# Patient Record
Sex: Female | Born: 1960 | Hispanic: No | Smoking: Never smoker
Health system: Southern US, Community
[De-identification: ages and names within clinical notes are randomized; demographics above are authoritative.]

## PROBLEM LIST (undated history)

## (undated) DIAGNOSIS — M5126 Other intervertebral disc displacement, lumbar region: Secondary | ICD-10-CM

## (undated) DIAGNOSIS — M797 Fibromyalgia: Secondary | ICD-10-CM

## (undated) DIAGNOSIS — R51 Headache: Secondary | ICD-10-CM

## (undated) DIAGNOSIS — R519 Headache, unspecified: Secondary | ICD-10-CM

## (undated) DIAGNOSIS — I10 Essential (primary) hypertension: Secondary | ICD-10-CM

## (undated) HISTORY — PX: TONSILLECTOMY: SUR1361

## (undated) HISTORY — PX: APPENDECTOMY: SHX54

---

## 2002-05-22 ENCOUNTER — Encounter: Admission: RE | Admit: 2002-05-22 | Discharge: 2002-05-22 | Payer: Self-pay

## 2002-06-25 ENCOUNTER — Encounter: Admission: RE | Admit: 2002-06-25 | Discharge: 2002-06-25 | Payer: Self-pay

## 2007-02-11 ENCOUNTER — Emergency Department (HOSPITAL_COMMUNITY): Admission: EM | Admit: 2007-02-11 | Discharge: 2007-02-11 | Payer: Self-pay | Admitting: Family Medicine

## 2007-12-30 ENCOUNTER — Emergency Department (HOSPITAL_COMMUNITY): Admission: EM | Admit: 2007-12-30 | Discharge: 2007-12-30 | Payer: Self-pay | Admitting: Emergency Medicine

## 2008-06-02 ENCOUNTER — Emergency Department (HOSPITAL_COMMUNITY): Admission: EM | Admit: 2008-06-02 | Discharge: 2008-06-02 | Payer: Self-pay | Admitting: Emergency Medicine

## 2008-07-13 ENCOUNTER — Emergency Department (HOSPITAL_COMMUNITY): Admission: EM | Admit: 2008-07-13 | Discharge: 2008-07-13 | Payer: Self-pay | Admitting: Emergency Medicine

## 2011-04-01 ENCOUNTER — Ambulatory Visit: Payer: Worker's Compensation | Admitting: Physical Medicine & Rehabilitation

## 2011-04-01 ENCOUNTER — Encounter: Payer: Worker's Compensation | Attending: Physical Medicine & Rehabilitation

## 2011-04-01 DIAGNOSIS — M545 Low back pain, unspecified: Secondary | ICD-10-CM | POA: Insufficient documentation

## 2011-04-01 DIAGNOSIS — R209 Unspecified disturbances of skin sensation: Secondary | ICD-10-CM | POA: Insufficient documentation

## 2011-04-01 DIAGNOSIS — M62838 Other muscle spasm: Secondary | ICD-10-CM | POA: Insufficient documentation

## 2011-04-01 DIAGNOSIS — M79609 Pain in unspecified limb: Secondary | ICD-10-CM | POA: Insufficient documentation

## 2011-04-01 DIAGNOSIS — Z79899 Other long term (current) drug therapy: Secondary | ICD-10-CM | POA: Insufficient documentation

## 2011-04-01 DIAGNOSIS — R61 Generalized hyperhidrosis: Secondary | ICD-10-CM | POA: Insufficient documentation

## 2011-04-01 DIAGNOSIS — R29898 Other symptoms and signs involving the musculoskeletal system: Secondary | ICD-10-CM | POA: Insufficient documentation

## 2011-04-01 DIAGNOSIS — IMO0002 Reserved for concepts with insufficient information to code with codable children: Secondary | ICD-10-CM

## 2011-04-01 NOTE — Assessment & Plan Note (Signed)
After I got history, did physical examination, and discussed treatment recommendations with patient, I called in Jonne Ply, her case manager and reviewed recommendations, these included: 1. Stop diclofenac. 2. Try taking 2 tramadol at night rather than 1 tramadol and 1 Talwin. 3. Continue Lyrica 75 b.i.d. 4. Continue the daytime tramadol. 5. MRI of the lumbar spine given that she does have some decreased     reflex in the right S1 as well as sensation deficits L3, 4, 5, and     S1 dermatomal distribution on the right side.  This would be more     than what we expected from previous MRIs. 6. Continue current restrictions which is basically none.  She can     continue working part-time.  I will see her for the epidural     injection approximately 1 month.     Erick Colace, M.D. Electronically Signed    AEK/MedQ D:  04/01/2011 11:17:18  T:  04/01/2011 23:04:06  Job #:  [id number]  cc:   Jonne Ply Fax [phone]

## 2011-04-16 NOTE — Consult Note (Signed)
HISTORY:  A 50 year old female who had a lifting injury at work at [employer] in February 2006, with low back pain radiating to the right lower extremity.  She was seen by Dr. Lequita Halt, at [facility], underwent MRI scan which demonstrated spondylosis L4-5, L5-S1, as well as small right lateral recess foraminal disk protrusion L4-5 contacts in L5 nerve root.  There is also a right-sided protrusion, small annular tear at L5-S1, but no nerve root impingement at that level.  She was reportedly seen by Dr. Ethelene Hal, I do not have any of those notes, was also seen by Dr. Otelia Sergeant, who was at [facility] at that time and I do not have any of those notes, however, I do have notes from Dr. Otelia Sergeant as well as Dr. Alvester Morin from [facility].  She has been working 4 hours a day which has been her pre-injury work schedule, she is not on any restrictions on her work and is doing this without significant difficulties.  Her pain medication regimen is Ultram 50 mg 1 tablet during the day and then 1 tablet in the evening before she goes to bed.  She uses Voltaren 75 mg twice a day, Talwin NX 50 mg bedtime.  She takes Lyrica 75 b.i.d. She has tried coming off the Lyrica, this resulted in increasing sciatic- type pain.  She has had good relief from L5-S1 translaminar epidural steroid injections and last one was performed by Dr. Alvester Morin on Feb 11, 2011, in transforaminal approach.  She has gone through physical therapy in the past, but not recently. She states that the epidurals are helpful for about 6 weeks' time.  Other radiological imaging not available to me, but reference was made on the [facility] notes, January 06, 2011 of a MRI dated Feb 22, 2008, showing a shallow left protruded disk L4-5, but then some correction was made about potentially right-sided at L5-S1.  SOCIAL HISTORY:  Her husband passed away unexpectedly last month.  She is still upset of course.  Denies any  alcohol or drug use.  She was started on some alprazolam just for some sleep aid since the death.  Her average pain is 3, but currently 5, described as sharp, pinching in the buttocks, stabbing, aching down the right leg.  She does have some left buttock pain as well.  Pain is worse with bending, sitting; improves with exercise, medication, injection.  She feels better standing up and sitting down.  REVIEW OF SYSTEMS:  Positive for weakness in legs, numbness, spasms, anxiety, weight gain, as well as night sweats.  FAMILY HISTORY:  Heart disease, lung disease, diabetes, alcohol abuse, hypertension.  PHYSICAL EXAMINATION:  VITAL SIGNS:  Blood pressure 154/83, pulse 73, respirations 18, O2 sat 97% on room air. GENERAL:  A well-developed, well-nourished, overweight female in no acute distress.  Orientation x3.  Affect alert.  Gait is normal. MUSCULOSKELETAL:  She has normal coordination in lower extremity.  Deep tendon reflex decreased at right S1, otherwise normal in the upper and lower extremities.  Her sensation decreased right L3, 4, 5, and S1 dermatomal distribution on the right side, otherwise normal.  Strength is normal bilateral upper and lower extremities.  She has no evidence of muscle atrophy in the upper and lower extremities.  Normal range of motion hip, knee, and ankle bilaterally.  Her back has no tenderness to palpation except at the lumbosacral junction.  Her lumbar spine range of motion is 75% range forward flexion, 25% extension, has some left-sided pain with  extension.  IMPRESSION: 1. Lumbar pain with right lower extremity pain which I believe is     radicular based on previous imaging studies looks like L5 likely     nerve root, although on examination today she has decreased S1     reflex, as well as additional sensory deficits L3, 4, 5, and S1     dermatomal distribution.  For this reason, I would like to repeat     MRI scan. 2. Medication management.  I do  think she is on a reasonable     combination of medications, my main concern is that she is on long-     term diclofenac.  She is not really having much in terms of stomach     upset, but still at a potential, we will see if she can stop this.     In terms of her Talwin NX, she takes this at night in combination     with the tramadol to help her sleep.  She has never tried taking 2     tramadol and I asked her to try this on a non-work night to see if     this is as effective.  She is to continue Lyrica 75 b.i.d., sounds     like she has tried to stop this and was not successful in terms of     that.  I will see her back for a repeat epidural injection.  Likely, it will be on the right side at L5-S1 transforaminal, but we will review MRI scan beforehand as this may influence the choice of location.  Discussed with patient, agrees with plan.     Erick Colace, M.D.    AEK/MedQ D:04/01/2011 11:15:01  T:04/01/2011 22:15:48  Job #:  [id number]  Electronically Signed by Claudette Laws M.D. on 04/16/2011 10:26:23 AM

## 2011-05-27 ENCOUNTER — Encounter: Payer: Worker's Compensation | Admitting: Physical Medicine & Rehabilitation

## 2011-05-31 ENCOUNTER — Encounter: Payer: Worker's Compensation | Admitting: Physical Medicine & Rehabilitation

## 2011-05-31 ENCOUNTER — Ambulatory Visit (HOSPITAL_BASED_OUTPATIENT_CLINIC_OR_DEPARTMENT_OTHER)
Admission: RE | Admit: 2011-05-31 | Discharge: 2011-05-31 | Disposition: A | Discharge status: Home / Self Care | Payer: Worker's Compensation | Source: Ambulatory Visit | Attending: Physical Medicine & Rehabilitation | Admitting: Physical Medicine & Rehabilitation

## 2011-05-31 DIAGNOSIS — IMO0002 Reserved for concepts with insufficient information to code with codable children: Secondary | ICD-10-CM | POA: Insufficient documentation

## 2011-05-31 NOTE — Procedures (Signed)
NAMEMARTENA, Emma Newman                  ACCOUNT NO.:  [id number]  MEDICAL RECORD NO.:  [id number]           PATIENT TYPE:  O  LOCATION:  [facility]                          FACILITY:  [facility]  PHYSICIAN:  Erick Colace, M.D.DATE OF BIRTH:  09/20/1961  DATE OF PROCEDURE:  05/31/2011 DATE OF DISCHARGE:  04/01/2011                              OPERATIVE REPORT  PROCEDURE:  Right L5-S1 transforaminal lumbar epidural steroid injection under fluoroscopic guidance.  INDICATION:  Right L5 radiculitis.  Informed consent was obtained after describing risks and benefits of the procedure with the patient.  These include bleeding, bruising, infection, temporary or permanent paralysis, she elects to proceed and has given written consent.  The patient placed prone on fluoroscopy table.  Betadine prep, sterile drape, 25-gauge inch and half needle was used to anesthetize the skin and subcutaneous tissue with 1% lidocaine x2 mL.  Then, a 22-gauge, 3-1/2-inch spinal needle was inserted under fluoroscopic guidance, starting at the right L5-S1 intervertebral foramen.  AP, lateral and oblique images utilized.  Once needle tip was in appropriate position, injecting the 2 mL of Omnipaque 180 demonstrated good epidural as well as nerve root spread followed by injection of 2 mL of 1% lidocaine with 1 mL of 10 mg/mL dexamethasone. The patient tolerated the procedure well.  Postprocedure instructions given.  Reviewed MRI prior to the procedure.  No new findings.  Does have a right-sided L5-S1 disk bulge.  MEDICATIONS:  We will increase Lyrica to 75 mg 2 p.o. nightly.     Erick Colace, M.D. Electronically Signed    AEK/MEDQ  D:  05/31/2011 14:03:17  T:  05/31/2011 22:47:39  Job:  [id number]

## 2011-06-15 ENCOUNTER — Ambulatory Visit: Payer: Worker's Compensation | Admitting: Physical Medicine & Rehabilitation

## 2011-06-22 ENCOUNTER — Encounter: Payer: Worker's Compensation | Attending: Physical Medicine & Rehabilitation

## 2011-06-22 ENCOUNTER — Ambulatory Visit: Payer: Worker's Compensation | Admitting: Physical Medicine & Rehabilitation

## 2011-06-22 DIAGNOSIS — R29898 Other symptoms and signs involving the musculoskeletal system: Secondary | ICD-10-CM | POA: Insufficient documentation

## 2011-06-22 DIAGNOSIS — M62838 Other muscle spasm: Secondary | ICD-10-CM | POA: Insufficient documentation

## 2011-06-22 DIAGNOSIS — M545 Low back pain, unspecified: Secondary | ICD-10-CM | POA: Insufficient documentation

## 2011-06-22 DIAGNOSIS — M5137 Other intervertebral disc degeneration, lumbosacral region: Secondary | ICD-10-CM

## 2011-06-22 DIAGNOSIS — Z79899 Other long term (current) drug therapy: Secondary | ICD-10-CM | POA: Insufficient documentation

## 2011-06-22 DIAGNOSIS — IMO0002 Reserved for concepts with insufficient information to code with codable children: Secondary | ICD-10-CM

## 2011-06-22 DIAGNOSIS — R209 Unspecified disturbances of skin sensation: Secondary | ICD-10-CM | POA: Insufficient documentation

## 2011-06-22 DIAGNOSIS — M47817 Spondylosis without myelopathy or radiculopathy, lumbosacral region: Secondary | ICD-10-CM

## 2011-06-22 DIAGNOSIS — M79609 Pain in unspecified limb: Secondary | ICD-10-CM | POA: Insufficient documentation

## 2011-06-22 DIAGNOSIS — R61 Generalized hyperhidrosis: Secondary | ICD-10-CM | POA: Insufficient documentation

## 2011-06-22 NOTE — Assessment & Plan Note (Signed)
REASON FOR VISIT:  Back pain and right lower extremity pain.  HISTORY:  A 50 year old female who has a history of right lower extremity pain as well as back pain.  She was involved in a lifting injury in 2006 [employer], low back pain right lower extremity.  Saw Dr. Malena Catholic from [facility], had some spondylosis at L4-5, L5-S1 as well as the small right lateral foraminal disk L4-5 contacting right L5 nerve root, right-sided protrusion, small annular tear L5-S1.  She has been seen by Dr. Otelia Sergeant as well as Dr. Ethelene Hal as well as Dr. Alvester Morin. We repeated MRI given that she was continuing to complain of symptoms that have not abated.  Compared to 2009 the L4-5 shallow disk bulge leftward has diminished size of small annular fissure.  Remainder of exam is unchanged, normal disk height at L5-S1 but shallow right-sided bulge without compression or displacement of the right S1 nerve root. Foramen are patent.  There is spondylosis at L4-5 mainly noted.  She underwent L5-S1 transforaminal lumbar epidural steroid injection on May 31, 2011 and has been unable to come off her Voltaren without any increase in pain.  She had some leg numbness during the procedure but this subsided after 30 minutes.  She continues to have some pain going down the right leg all the way to the foot lateral border.  She also has back pain.  Her average pain is 6/10, right now 3.  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  REVIEW OF SYSTEMS:  Weakness, numbness, spasms, depression, constipation, weight loss.  SOCIAL HISTORY:  Widowed, lives alone.  PHYSICAL EXAMINATION:  VITAL SIGNS:  Blood pressure 144/69, pulse 65, respirations 16, O2 sat 96% on room air. GENERAL:  No acute stress.  Mood and affect appropriate. MUSCULOSKELETAL:  Her back has some pain with extension of lumbar spine. She has negative straight leg raising test.  Lower extremity strength is normal but has decreased deep  tendon reflex, right S1.  The remainder are normal.  IMPRESSION: 1. Lumbosacral radiculitis as well as lumbosacral spondylosis.  We     will concentrate on treating her radiculitis first.  She is on     Lyrica 75 mg during day, 150 at night.  This has helped somewhat. 2. We will repeat transforaminal injection but go down to the S1 level     to address the S1 clinical radiculopathy. 3. Start nortriptyline at night. 4. Stop the Talwin.  This may be counteracting the effect of her     tramadol during the day. 5. Valium to the procedure.  I discussed with the patient, agrees with     plan.  May consider medial branch blocks for the lumbar pain should     the S1 injection not provide any relief of the back pain.     Erick Colace, M.D. Electronically Signed    AEK/MedQ D:  06/22/2011 11:29:30  T:  06/22/2011 20:27:09  Job #:  [id number]

## 2011-06-22 NOTE — Assessment & Plan Note (Signed)
Case manager conference with Emma Newman, with the patient present during the entire time.  We discussed: 1. Right S1 transforaminal lumbar epidural steroid injection under     fluoroscopic guidance. 2. Possible medial branch blocks depending on effect of the above. 3. Continue Lyrica 75 mg q.a.m. and 150 mg at bedtime. 4. Nortriptyline 10 mg at bedtime. 5. Discontinue tramadol 50 mg two p.o. b.i.d. 6. Continue current restrictions for work.     Erick Colace, M.D. Electronically Signed    AEK/MedQ D:  06/22/2011 11:30:44  T:  06/22/2011 21:10:38  Job #:  [id number]

## 2011-07-01 ENCOUNTER — Ambulatory Visit: Payer: Worker's Compensation | Admitting: Physical Medicine & Rehabilitation

## 2011-07-01 ENCOUNTER — Encounter: Payer: Worker's Compensation | Attending: Physical Medicine & Rehabilitation

## 2011-07-01 DIAGNOSIS — IMO0002 Reserved for concepts with insufficient information to code with codable children: Secondary | ICD-10-CM

## 2011-07-01 NOTE — Procedures (Signed)
[name], Emma Newman                  ACCOUNT NO.:  [id number]  MEDICAL RECORD NO.:  [id number]           PATIENT TYPE:  O  LOCATION:  [facility]                          FACILITY:  [facility]  PHYSICIAN:  Erick Colace, M.D.DATE OF BIRTH:  1960-10-30  DATE OF PROCEDURE:  07/01/2011 DATE OF DISCHARGE:                              OPERATIVE REPORT  PROCEDURE:  Right S1 transforaminal lumbar epidural steroid injection under fluoroscopic guidance.  INDICATION:  Right S1 radiculitis.  Pain is only partially responsive to medication management as well as a right L5 epidural.  Informed consent was obtained after describing risks and benefits of the procedure with the patient.  These include bleeding, bruising and infection.  She elects to proceed and has given written consent.  The patient placed prone on fluoroscopy table.  Betadine prep, sterile drape, 25-gauge inch and half needle was used to anesthetize the skin and subcu tissue 1% lidocaine x2 mL.  Then a, 22-gauge, 3-1/2-inch spinal needle was inserted under fluoroscopic guidance, starting at the right S1 foramen.  AP and lateral images utilized.  Omnipaque 180 x2 mL demonstrated good epidural spread as well as nerve root spread, followed by injection of 1 mL of 10 mg/mL dexamethasone and 2 mL of 1% MPF lidocaine.  The patient tolerated the procedure well.  Pre and post injection vitals stable.  She will follow up with me in 2 weeks.  We will discuss further treatment from there.  If she has some residual back pain may need to schedule for medial branch blocks.     Erick Colace, M.D. Electronically Signed    AEK/MEDQ  D:  07/01/2011 10:06:38  T:  07/01/2011 10:19:39  Job:  [id number]

## 2011-07-05 LAB — POCT URINALYSIS DIP (DEVICE)
Bilirubin Urine: NEGATIVE
Glucose, UA: NEGATIVE
Ketones, ur: NEGATIVE
Nitrite: POSITIVE — AB
Operator id: 239701
Protein, ur: >=300 — AB
Specific Gravity, Urine: 1.015
Urobilinogen, UA: 0.2
pH: 6.5

## 2011-07-05 LAB — POCT PREGNANCY, URINE
Operator id: 239701
Preg Test, Ur: NEGATIVE

## 2011-07-19 ENCOUNTER — Ambulatory Visit: Payer: Worker's Compensation | Admitting: Physical Medicine & Rehabilitation

## 2011-07-19 ENCOUNTER — Encounter: Payer: Worker's Compensation | Attending: Physical Medicine & Rehabilitation

## 2011-07-19 DIAGNOSIS — IMO0002 Reserved for concepts with insufficient information to code with codable children: Secondary | ICD-10-CM

## 2011-07-19 NOTE — Assessment & Plan Note (Signed)
REASON FOR VISIT:  Back pain and right lower extremity pain.  HISTORY:  A 50 year old female with history of right lower extremity pain as well as back pain, lifting injury in 2006, [employer].  Seen by Orthopedics, right lateral foraminal disk contact in the right L5 nerve root.  She also has some symptoms in the right S1 dermatome distribution and repeat MRI did show some right-sided disk bulge L5-S1, just placing the right S1 nerve root.  She actually responded better to the S1 injection and L5 injection.  She is employed 4 hours a day _______ at [employer].  REVIEW OF SYSTEMS:  Numbness, depression, constipation, poor appetite. Mood and affect appeared to be rider today.  SOCIAL HISTORY:  Widowed within last few months, lives alone, nonsmoker, nondrinker.  PHYSICAL EXAMINATION:  GENERAL:  No acute distress.  Mood and affect are brighter. VITAL SIGNS:  Blood pressure 121/71, pulse 85, respirations 18, and O2 sat 100% on room air.  Oswestry index score is 28% today compared to 40% last time. EXTREMITIES:  Straight leg raising test is negative.  Deep tendon reflexes are normal.  Lower extremity strength is normal.  IMPRESSION:  Lumbosacral radiculitis, lumbosacral spondylosis.  Mainly her lower extremity pain that bothers her, I do not think we need to repeat her epidural injection at this time.  I think she would benefit from medial branch blocks.  We will increase her nortriptyline at night 25 mg, she should have stopped the Talwin last time but has not I recommended to stop this, this may be counteracting tramadol given that it does have some opioid antagonist.  Continue Lyrica 75 daily and 150 nightly.  Continue tramadol 100 mg b.i.d.  Discussed with the patient.  I also have the case manager in with her today and we discussed treatment plan, I do not think the patient has had MMI yet.  I will reassess in 1 month.  If she has progressive reduction in her functional status would  repeat epidural.     Emma Newman, M.D. Electronically Signed    AEK/MedQ D:  07/19/2011 09:57:07  T:  07/19/2011 11:23:07  Job #:  [id number]  cc:   Jonne Ply Fax [phone]

## 2011-08-19 ENCOUNTER — Encounter: Payer: Worker's Compensation | Attending: Physical Medicine & Rehabilitation | Admitting: Neurosurgery

## 2011-08-19 DIAGNOSIS — M545 Low back pain: Secondary | ICD-10-CM

## 2011-08-19 DIAGNOSIS — IMO0002 Reserved for concepts with insufficient information to code with codable children: Secondary | ICD-10-CM | POA: Insufficient documentation

## 2011-08-19 DIAGNOSIS — M5137 Other intervertebral disc degeneration, lumbosacral region: Secondary | ICD-10-CM

## 2011-08-19 NOTE — Assessment & Plan Note (Signed)
REASON FOR VISIT:  Back pain and right lower extremity pain.  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  She had her last MRI performed on April 13, 2011 showing a shallow rightward disk bulge at L5-S1, mild foraminal narrowing at L4-5.  She responded better to and S1 transforaminal than to an L5 transforaminal.  Her pain during the day is 3/10, at the end of the 5/10, pain interferes with activity at a moderate level.  Bending and sitting seems to aggravate her pain.  She has continued numbness in the right lower extremity.  She has some pain behind the knee as well.  Occasional swelling in the knee. She can walk 10-15 minutes at a time.  She climbs steps.  She can drive. She works at [employer] 18-20 hours a week.  She has chronic bladder problems, numbness, trouble walking, depression, diarrhea, constipation, and ulceration.  SOCIAL HISTORY:  Widowed, lives alone.  Nonsmoker, nondrinker.  OBJECTIVE:  VITAL SIGNS:  Blood pressure 141/73, pulse 69, respirations 16, O2 saturation 97% on room air, and weight 184 pounds. MUSCULOSKELETAL:  Straight leg raising test is negative.  Deep tendon reflexes are normal.  Lower extremity strength is normal.  She has some tenderness along the left lateral hamstring tendon.  No evidence of swelling in the knee.  Hip, knee, and ankle range of motion are normal.  IMPRESSION: 1. Chronic left S1 radiculitis as I discussed with the patient     epidurals will not get rid of the numbness, but can help manage her     pain.  I do think that the numbness is going to be a chronic     ongoing issue and while there is no current compression of her     spinal nerve root.  She likely had compression for a period of     time. 2. Overall she is doing well.  She is functioning well.  She still may     need a repeat epidural and we will continue to monitor over the     next month or  so.  Her current medications appeared to be handling     her pain well.  I did ask her to hold her Lyrica dose on an off day     in the morning and see what her responses and then the following     off a day hold her tramadol doses to see how much each medicine is     relatively contributing to her pain relief.  I would recommend that     she continues her nortriptyline 25 mg p.o. at bedtime 3. I had a meeting with case manager in the presence of the patient.     We discussed the above and questions were answered.     Erick Colace, M.D. Electronically Signed    AEK/MedQ D:  08/19/2011 09:42:31  T:  08/19/2011 10:07:51  Job #:  [id number]  cc:   Jonne Ply [phone]

## 2011-09-17 ENCOUNTER — Encounter: Payer: Worker's Compensation | Attending: Physical Medicine & Rehabilitation

## 2011-09-17 ENCOUNTER — Ambulatory Visit: Payer: Worker's Compensation | Admitting: Physical Medicine & Rehabilitation

## 2011-09-17 DIAGNOSIS — IMO0002 Reserved for concepts with insufficient information to code with codable children: Secondary | ICD-10-CM | POA: Insufficient documentation

## 2011-09-28 ENCOUNTER — Ambulatory Visit: Payer: Worker's Compensation | Admitting: Physical Medicine & Rehabilitation

## 2011-09-28 DIAGNOSIS — IMO0002 Reserved for concepts with insufficient information to code with codable children: Secondary | ICD-10-CM

## 2011-09-28 NOTE — Assessment & Plan Note (Signed)
REASON FOR VISIT:  Right lower extremity pain and back pain.  HISTORY:  A 50 year old female with lumbar radiculitis.  She has had epidural steroid injection, last on July 01, 2011.  She had a 75% relief initially and she was doing well when I last saw her on November 8, but shortly thereafter injections started wearing off.  Her pain is about 3/10 currently, does interfere with sleep.  It increases with bending, sitting, inactivity, improves with rest, some exercise, medication.  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  REVIEW OF SYSTEMS:  Positive for depression, anxiety, numbness, tingling.  Of note is that she lost her husband in the past year, this is her first Christmas without him. She has had no other new medical problems in the interval time.  CURRENT MEDS: 1. Nortriptyline 25 at bedtime. 2. Lyrica 75 in the morning and 150 at bedtime. 3. Tramadol 100 mg b.i.d.  We discussed her medications she thinks that she could get by with less Lyrica at night.  PHYSICAL EXAMINATION:  GENERAL:  No acute distress.  Mood and affect appropriate. VITAL SIGNS:  Blood pressure 141/86, pulse 79, respirations 18, and O2 saturation 97% on room air. GENERAL:  No acute stress.  Mood and affect appropriate.  She can ambulate without evidence of toe drag or knee instability.  She can toe walk and heel walk.  She has decreased sensation right S1 dermatomal distribution.  Mildly reduced right S1 reflex, no strength deficits. Straight leg raising test is negative.  Lower extremity range of motion is  normal.  She has some tenderness over the greater trochanter on the right side.  IMPRESSION: 1. Chronic right S1 radiculitis.  I will repeat S1 transforaminal     injection given that she had good relief of 75% plus,  lasting for     over 2 months. 2. We will try adjusting medications, reduce the Lyrica as 75 b.i.d.     We will see how she does with this change when I do the injection     in about 2-3 weeks.  We will continue tramadol 100 mg b.i.d. and     nortriptyline 25 at bedtime.  ADDENDUM:  The above was discussed with Jonne Ply, case manager.     Erick Colace, M.D. Electronically Signed    AEK/MedQ D:  09/28/2011 09:31:15  T:  09/28/2011 11:50:13  Job #:  [id number]  cc:   Jonne Ply fax [phone]

## 2011-10-28 ENCOUNTER — Ambulatory Visit: Payer: Worker's Compensation | Admitting: Physical Medicine & Rehabilitation

## 2011-10-28 ENCOUNTER — Encounter: Payer: Worker's Compensation | Attending: Physical Medicine & Rehabilitation

## 2011-10-28 DIAGNOSIS — IMO0002 Reserved for concepts with insufficient information to code with codable children: Secondary | ICD-10-CM | POA: Insufficient documentation

## 2011-10-29 NOTE — Procedures (Signed)
Emma Newman, Emma Newman                  ACCOUNT NO.:  [id number]  MEDICAL RECORD NO.:  [id number]           PATIENT TYPE:  O  LOCATION:  [facility]                          FACILITY:  [facility]  PHYSICIAN:  Erick Colace, M.D.DATE OF BIRTH:  12/04/1960  DATE OF PROCEDURE: DATE OF DISCHARGE:                              OPERATIVE REPORT  PROCEDURE:  Right S1 transforaminal lumbar epidural steroid injection under fluoroscopic guidance.  INDICATION:  Right S1 radiculitis.  She has had very good relief with prior injections starting to wear off now.  Informed consent was obtained after describing risks and benefits of the procedure with the patient including bleeding, bruising and infection. She elected to proceed and has given written consent.  Proper patient, proper procedure confirmed.  The patient placed prone on fluoroscopy table.  Betadine prep, sterile drape, 25-gauge inch and a half needle was used to anesthetize the skin and subcu tissue 1% lidocaine x2 mL. Then, a 22-gauge, 3.5 inch spinal needle was inserted under fluoroscopic guidance starting at the right S1 foramen.  AP and lateral images utilized.  Omnipaque 180 under live fluoro x2 mL demonstrated good epidural and nerve root spread, followed by injection of 2 mL of 1% MPF lidocaine and 1 mL of 10 mg/mL dexamethasone.  The patient tolerated the procedure well.  Postprocedure instructions given.     Erick Colace, M.D. Electronically Signed    AEK/MEDQ  D:  10/28/2011 11:27:09  T:  10/28/2011 18:04:45  Job:  [id number]

## 2011-11-29 ENCOUNTER — Ambulatory Visit: Payer: Worker's Compensation | Admitting: Physical Medicine & Rehabilitation

## 2011-11-29 ENCOUNTER — Encounter: Payer: Worker's Compensation | Attending: Physical Medicine & Rehabilitation

## 2011-11-29 DIAGNOSIS — M5137 Other intervertebral disc degeneration, lumbosacral region: Secondary | ICD-10-CM

## 2011-11-29 DIAGNOSIS — IMO0002 Reserved for concepts with insufficient information to code with codable children: Secondary | ICD-10-CM | POA: Insufficient documentation

## 2011-11-29 NOTE — Assessment & Plan Note (Signed)
REASON FOR VISIT:  Chronic right greater than left lower extremity pain as well as low back pain.  A 51 year old female with work-related injury in 2006 at [employer].  She has been seen by Orthopedics.  She does have x-rays and MRIs demonstrated lumbar spondylosis as well as a right disk protrusion L4-5 contracting the L5 nerve root, small annular tear at L5-S1.  She has been treated with combination of Ultram, Lyrica, and nortriptyline most recently at one point was on Talwin as well, but we weaned her off of that.  She was trialed on SI injections which were helpful, right L5 level, then we did right S1.  She actually responded better to S1 and L5.  This correlated more with her physical exam, but left with her radiologic exam.  Her last S1 injection was on October 28, 2011.  Her pain is in the 3- 4/10 level.  She has trialed on reduced dose of Lyrica 50 mg b.i.d. but this resulted in increased pain compared to 75 mg.  No other new problems in the interval time.  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  REVIEW OF SYSTEMS:  Numbness, tingling mainly in the lower extremities, in the feet, spasms in the back.  PAST SURGICAL HISTORY:  Appendectomy in 1981.  SOCIAL HISTORY:  Widowed, lives alone.  Physically active, outside of work.  OBJECTIVE:  VITAL SIGNS:  Blood pressure 147/94, pulse 88, respirations 16, O2 sat 97% on room air. MUSCULOSKELETAL:  Straight leg raising test is negative.  Bilateral S1 reflexes are 1 and 2+ at the knees.  Straight leg raising negative. Hip, knee, and ankle range of motion are full.  Back range of motion full.  IMPRESSION:  Lumbosacral radiculitis due to lumbar disk disease, nonprogressive.  PLAN: 1. We will start her back on Lyrica 75 mg twice a day.  Continue on     tramadol 50 two p.o. b.i.d. 2. Continue nortriptyline 25 at bedtime. 3. I will see her back in 1 month.  If she has gotten back to her     usual baseline with the increased  dose of Lyrica, I think she will     be at MMI and ready for rate and release.     Erick Colace, M.D. Electronically Signed    AEK/MedQ D:  11/29/2011 11:55:41  T:  11/29/2011 16:10:96  Job #:  [id number]  cc:   Fax #[phone] Jonne Ply

## 2012-01-03 ENCOUNTER — Encounter: Payer: Self-pay | Admitting: Physical Medicine & Rehabilitation

## 2012-01-03 ENCOUNTER — Encounter: Payer: Worker's Compensation | Attending: Physical Medicine & Rehabilitation

## 2012-01-03 ENCOUNTER — Ambulatory Visit (HOSPITAL_BASED_OUTPATIENT_CLINIC_OR_DEPARTMENT_OTHER): Payer: Worker's Compensation | Admitting: Physical Medicine & Rehabilitation

## 2012-01-03 ENCOUNTER — Encounter: Payer: Self-pay | Admitting: *Deleted

## 2012-01-03 VITALS — BP 144/77 | HR 75 | Resp 16 | Ht 68.0 in | Wt 184.6 lb

## 2012-01-03 DIAGNOSIS — IMO0002 Reserved for concepts with insufficient information to code with codable children: Secondary | ICD-10-CM

## 2012-01-03 DIAGNOSIS — M5416 Radiculopathy, lumbar region: Secondary | ICD-10-CM | POA: Insufficient documentation

## 2012-01-03 DIAGNOSIS — M545 Low back pain: Secondary | ICD-10-CM | POA: Insufficient documentation

## 2012-01-03 DIAGNOSIS — M5137 Other intervertebral disc degeneration, lumbosacral region: Secondary | ICD-10-CM | POA: Insufficient documentation

## 2012-01-03 DIAGNOSIS — M51379 Other intervertebral disc degeneration, lumbosacral region without mention of lumbar back pain or lower extremity pain: Secondary | ICD-10-CM | POA: Insufficient documentation

## 2012-01-03 NOTE — Progress Notes (Signed)
Subjective:    Patient ID: Emma Newman, female    DOB: 1960-12-19, 51 y.o.   MRN: [id number]  HPI  REASON FOR VISIT: Chronic right greater than left lower extremity pain  as well as low back pain.  A 51 year old female with work-related injury in 2006 at [employer]. She has  been seen by Orthopedics. She does have x-rays and MRIs demonstrated  lumbar spondylosis as well as a right disk protrusion L4-5 contracting  the L5 nerve root, small annular tear at L5-S1. She has been treated  with combination of Ultram, Lyrica, and nortriptyline most recently at  one point was on Talwin as well, but we weaned her off of that. She was  trialed on SI injections which were helpful, right L5 level, then we did  right S1. She actually responded better to S1 and L5. This correlated  more with her physical exam, but left with her radiologic exam.  Her last S1 injection was on October 28, 2011. Her pain is in the 3-  4/10 level. She has trialed on reduced dose of Lyrica 50 mg b.i.d. but  this resulted in increased pain compared to 75 mg. Patient recently started on blood pressure medicine by primary care as well as anxiety medicine for primary care. Pain Inventory Average Pain 3 Pain Right Now 2 My pain is intermittent, sharp, dull, stabbing and tingling  In the last 24 hours, has pain interfered with the following? General activity 1 Relation with others 2 Enjoyment of life 2 What TIME of day is your pain at its worst? evening and night Sleep (in general) Fair  Pain is worse with: walking, bending, sitting, inactivity and some activites Pain improves with: rest, heat/ice, pacing activities and medication Relief from Meds: 7  Mobility walk without assistance how many minutes can you walk? 15 ability to climb steps?  yes do you drive?  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  no  Physicians involved in your care Any changes since last visit?  no     Review of Systems  Constitutional: Positive for unexpected weight change.       Weight loss  Gastrointestinal: Positive for constipation.  Genitourinary: Positive for difficulty urinating.       Retention  Neurological: Positive for dizziness.  Psychiatric/Behavioral: The patient is nervous/anxious.   All other systems reviewed and are negative.       Objective:   Physical Exam  Constitutional: She is oriented to person, place, and time. She appears well-developed and well-nourished.  HENT:  Head: Normocephalic and atraumatic.  Musculoskeletal:       Right hip: Normal.       Left hip: Normal.       Right ankle: Normal.       Left ankle: Normal.       Lumbar back: She exhibits decreased range of motion. She exhibits no tenderness, no bony tenderness, no swelling, no edema and no deformity.  Neurological: She is alert and oriented to person, place, and time. She has normal strength and normal reflexes. A sensory deficit is present. Coordination normal.  Reflex Scores:      Patellar reflexes are 2+ on the right side and 2+ on the left side.      Achilles reflexes are 2+ on the right side and 2+ on the left side.  Sensation reduced bilateral S1 dermatomes as well as right L5 dermatome straight leg raising test is negative    Skin: Skin is warm and dry.  Psychiatric: She has a normal mood and affect. Her behavior is normal. Judgment and thought content normal.          Assessment & Plan:  1. 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 She has some left S1 symptoms today which are mild. Will hold off on any imaging studies unless this worsens. Last MRI did not demonstrate any left-sided findings. 2. Chronic pain after work-related injury we'll continue current medications. We'll need  to refill today. This clinic is prescribing the nortriptyline, pregabalin, and tramadol.

## 2012-01-03 NOTE — Patient Instructions (Signed)
If patient remains stable next month anticipate [facility] Continue current restrictions.

## 2012-01-04 ENCOUNTER — Encounter: Payer: Self-pay | Admitting: *Deleted

## 2012-01-04 NOTE — Progress Notes (Signed)
Mail order pharmacy is [facility]  [address], [place]  [place]  [address]   Ph # [phone].  Was not in database so could not be loaded as a pharmacy "favorite".

## 2012-02-04 ENCOUNTER — Ambulatory Visit: Payer: Self-pay | Admitting: Physical Medicine & Rehabilitation

## 2012-02-05 DIAGNOSIS — F332 Major depressive disorder, recurrent severe without psychotic features: Secondary | ICD-10-CM | POA: Insufficient documentation

## 2012-02-22 ENCOUNTER — Encounter: Payer: Self-pay | Admitting: Physical Medicine & Rehabilitation

## 2012-02-22 ENCOUNTER — Other Ambulatory Visit: Payer: Self-pay | Admitting: *Deleted

## 2012-02-22 ENCOUNTER — Ambulatory Visit (HOSPITAL_BASED_OUTPATIENT_CLINIC_OR_DEPARTMENT_OTHER): Payer: Worker's Compensation | Admitting: Physical Medicine & Rehabilitation

## 2012-02-22 ENCOUNTER — Encounter: Payer: Worker's Compensation | Attending: Physical Medicine & Rehabilitation

## 2012-02-22 VITALS — BP 125/68 | HR 71 | Resp 16 | Ht 68.0 in | Wt 177.2 lb

## 2012-02-22 DIAGNOSIS — M5137 Other intervertebral disc degeneration, lumbosacral region: Secondary | ICD-10-CM

## 2012-02-22 DIAGNOSIS — IMO0002 Reserved for concepts with insufficient information to code with codable children: Secondary | ICD-10-CM | POA: Insufficient documentation

## 2012-02-22 MED ORDER — TRAMADOL HCL 50 MG PO TABS: 100.0000 mg | ORAL_TABLET | Freq: Two times a day (BID) | ORAL | Status: DC

## 2012-02-22 MED ORDER — PREGABALIN 75 MG PO CAPS: 75.0000 mg | ORAL_CAPSULE | Freq: Three times a day (TID) | ORAL | Status: DC

## 2012-02-22 NOTE — Patient Instructions (Signed)
Please continue your core stabilization exercises that you have received from physical therapy.

## 2012-02-22 NOTE — Telephone Encounter (Signed)
Tramadol order sent to [facility] mail order pharmacy.  This IS  the pharmacy that handles the [facility] Emma Newman is requesting her medications sent to.

## 2012-02-22 NOTE — Progress Notes (Signed)
  Subjective:    Patient ID: Emma Newman, female    DOB: 1961/01/18, 51 y.o.   MRN: [id number]  HPI  Hospitalized April 25 through May 1 for serotonin syndrome. She was taken off her medications. She is now off her nortriptyline. Her Celexa which was prescribed by primary M.D. He is also off. She is now on Wellbutrin XL 150 mg. She continues on the Lyrica 75 mg twice a day. She continues on the tramadol 100 mg twice a day and the Xanax continues as needed this is also prescribed by her primary M.D. Pain Inventory Average Pain 2 Pain Right Now 2 My pain is dull, tingling and aching  In the last 24 hours, has pain interfered with the following? General activity 3 Relation with others 3 Enjoyment of life 2 What TIME of day is your pain at its worst? evening and night Sleep (in general) Fair  Pain is worse with: bending, sitting, inactivity and some activites Pain improves with: rest, pacing activities and medication Relief from Meds: 7  Mobility walk without assistance how many minutes can you walk? 15--20 ability to climb steps?  yes do you drive?  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  no  Physicians involved in your care Any changes since last visit?  no     Review of Systems  Neurological: Positive for tremors, weakness and numbness.  All other systems reviewed and are negative.       Objective:   Physical Exam  Constitutional: She is oriented to person, place, and time. She appears well-developed and well-nourished.  Neurological: She is alert and oriented to person, place, and time. She has normal strength. Atrophy: reduced bilateral L4, L5, S1 sensation. A sensory deficit is present. Gait abnormal.  Reflex Scores:      Patellar reflexes are 1+ on the right side and 2+ on the left side.      Achilles reflexes are 1+ on the right side and 2+  on the left side. Psychiatric: She has a normal mood and affect.  inch of motion in the lumbar area is reduced with twisting by about 50% but otherwise has full range with forward flexion and extension. Hip knee and ankle range of motion are normal Lumbar spine has no tenderness to palpation     Assessment & Plan:  1. Lumbar degenerative disc overall doing well. Able to work on her usual part-time basis. Medications have been reviewed. Recent history reviewed. I think the current pain medications which consist of Lyrica 75 mg twice a day Tramadol 100 mg twice a day This appears to be helping her pain and I do not think we need can get rid of these. I think these will be permanent medications RTC 6 mo Pt at [facility] Agree with prior rating Discussed with Emma Newman CM RN

## 2012-08-24 ENCOUNTER — Encounter: Payer: Self-pay | Admitting: Physical Medicine & Rehabilitation

## 2012-08-24 ENCOUNTER — Ambulatory Visit (HOSPITAL_BASED_OUTPATIENT_CLINIC_OR_DEPARTMENT_OTHER): Payer: Worker's Compensation | Admitting: Physical Medicine & Rehabilitation

## 2012-08-24 ENCOUNTER — Encounter: Payer: Worker's Compensation | Attending: Physical Medicine & Rehabilitation

## 2012-08-24 VITALS — BP 152/95 | HR 67 | Resp 14 | Ht 68.0 in | Wt 200.0 lb

## 2012-08-24 DIAGNOSIS — M545 Low back pain, unspecified: Secondary | ICD-10-CM | POA: Insufficient documentation

## 2012-08-24 DIAGNOSIS — M5137 Other intervertebral disc degeneration, lumbosacral region: Secondary | ICD-10-CM | POA: Insufficient documentation

## 2012-08-24 DIAGNOSIS — IMO0002 Reserved for concepts with insufficient information to code with codable children: Secondary | ICD-10-CM

## 2012-08-24 DIAGNOSIS — M51379 Other intervertebral disc degeneration, lumbosacral region without mention of lumbar back pain or lower extremity pain: Secondary | ICD-10-CM | POA: Insufficient documentation

## 2012-08-24 DIAGNOSIS — M79609 Pain in unspecified limb: Secondary | ICD-10-CM | POA: Insufficient documentation

## 2012-08-24 DIAGNOSIS — G8929 Other chronic pain: Secondary | ICD-10-CM

## 2012-08-24 MED ORDER — TRAMADOL HCL 50 MG PO TABS: 100.0000 mg | ORAL_TABLET | Freq: Two times a day (BID) | ORAL | Status: DC

## 2012-08-24 MED ORDER — PREGABALIN 75 MG PO CAPS: 75.0000 mg | ORAL_CAPSULE | Freq: Three times a day (TID) | ORAL | Status: DC

## 2012-08-24 NOTE — Progress Notes (Signed)
Subjective:    Patient ID: Emma Newman, female    DOB: 1961-01-24, 51 y.o.   MRN: [id number] Chronic right greater than left lower extremity pain  as well as low back pain.  A 51 year old female with work-related injury in 2006 at [employer]. She has  been seen by Orthopedics. She does have x-rays and MRIs demonstrated  lumbar spondylosis as well as a right disk protrusion L4-5 contracting  the L5 nerve root, small annular tear at L5-S1. She has been treated  with combination of Ultram, Lyrica, and nortriptyline most recently at  one point was on Talwin as well, but we weaned her off of that. She was  trialed on SI injections which were helpful, right L5 level, then we did  right S1. She actually responded better to S1 and L5. This correlated  more with her physical exam, but left with her radiologic exam.  Her last S1 injection was on October 28, 2011. HPI Hospitalized April 25 through May 1 for serotonin syndrome. She was taken off her medications. She is now off her nortriptyline. Her Celexa which was prescribed by primary M.D. He is also off. She is now on Wellbutrin XL 150 mg. She continues on the Lyrica 75 mg twice a day. She continues on the tramadol 100 mg twice a day and the Xanax continues as needed this is also prescribed by her primary M.D Pain Inventory Average Pain 4 Pain Right Now 3 My pain is sharp, dull, tingling and aching  In the last 24 hours, has pain interfered with the following? General activity 2 Relation with others 2 Enjoyment of life 2 What TIME of day is your pain at its worst? evening Sleep (in general) Fair  Pain is worse with: sitting, inactivity and some activites Pain improves with: rest, pacing activities and medication Relief from Meds: 7  Mobility walk without assistance how many minutes can you walk? 30 ability to climb steps?  yes do you drive?  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  no  Physicians involved in your care Any changes since last visit?  no   Family History  Problem Relation Age of Onset  . Aneurysm Mother 6    died from complications of AAA  . Cancer Mother     breast cancer  . Cancer Father 20    mesothelioma  . Heart disease Father     cardiomyopathy   History   Social History  . Marital Status: Married    Spouse Name: N/A    Number of Children: N/A  . Years of Education: N/A   Social History Main Topics  . Smoking status: Never Smoker   . Smokeless tobacco: Never Used  . Alcohol Use: None  . Drug Use: None  . Sexually Active: None   Other Topics Concern  . None   Social History Narrative  . None   Past Surgical History  Procedure Date  . Appendectomy     1981   Past Medical History  Diagnosis Date  . Neuromuscular disorder    BP 152/95  Pulse 67  Resp 14  Ht 5\' 8"  (1.727 m)  Wt 200 lb (90.719 kg)  BMI 30.41 kg/m2  SpO2 98%     Review of Systems  Constitutional: Positive for unexpected weight change.  Gastrointestinal: Positive for constipation.  Musculoskeletal: Positive for back pain.  Neurological: Positive for weakness  and numbness.  All other systems reviewed and are negative.       Objective:   Physical Exam  Decreased L L4 and R L5 pinprick  Constitutional: She is oriented to person, place, and time. She appears well-developed and well-nourished.  Neurological: She is alert and oriented to person, place, and time. She has normal strength. . A sensory deficit is present. Gait abnormal.  Reflex Scores:      Patellar reflexes are 1+ on the right side and 2+ on the left side.      Achilles reflexes are 1+ on the right side and 2+ on the left side. Psychiatric: She has a normal mood and affect.  inch of motion in the lumbar area is reduced with twisting by about 25% but otherwise has full range with  forward flexion and extension. Hip knee and ankle range of motion are normal Lumbar spine has no tenderness to palpation     Assessment & Plan:  1. Lumbar degenerative disc overall doing well. Able to work on her usual part-time basis. Medications have been reviewed. Recent history reviewed. I think the current pain medications which consist of Lyrica 75 mg twice a day Tramadol 100 mg twice a day This appears to be helping her pain and I do not think we need can get rid of these. I think these will be permanent medications RTC 6 mo Pt at [facility] Back streching ex rec

## 2012-08-24 NOTE — Patient Instructions (Signed)

## 2012-09-03 ENCOUNTER — Emergency Department (HOSPITAL_COMMUNITY): Payer: Self-pay

## 2012-09-03 ENCOUNTER — Emergency Department (HOSPITAL_COMMUNITY)
Admission: EM | Admit: 2012-09-03 | Discharge: 2012-09-03 | Disposition: A | Discharge status: Home / Self Care | Payer: No Typology Code available for payment source | Attending: Emergency Medicine | Admitting: Emergency Medicine

## 2012-09-03 ENCOUNTER — Encounter (HOSPITAL_COMMUNITY): Payer: Self-pay | Admitting: Emergency Medicine

## 2012-09-03 DIAGNOSIS — S139XXA Sprain of joints and ligaments of unspecified parts of neck, initial encounter: Secondary | ICD-10-CM | POA: Insufficient documentation

## 2012-09-03 DIAGNOSIS — Y9389 Activity, other specified: Secondary | ICD-10-CM | POA: Insufficient documentation

## 2012-09-03 DIAGNOSIS — Z8739 Personal history of other diseases of the musculoskeletal system and connective tissue: Secondary | ICD-10-CM | POA: Insufficient documentation

## 2012-09-03 DIAGNOSIS — R51 Headache: Secondary | ICD-10-CM

## 2012-09-03 DIAGNOSIS — S0990XA Unspecified injury of head, initial encounter: Secondary | ICD-10-CM | POA: Insufficient documentation

## 2012-09-03 DIAGNOSIS — S161XXA Strain of muscle, fascia and tendon at neck level, initial encounter: Secondary | ICD-10-CM

## 2012-09-03 HISTORY — DX: Other intervertebral disc displacement, lumbar region: M51.26

## 2012-09-03 MED ORDER — OXYCODONE-ACETAMINOPHEN 5-325 MG PO TABS: 1.0000 | ORAL_TABLET | ORAL | Status: DC | PRN

## 2012-09-03 MED ORDER — HYDROCODONE-ACETAMINOPHEN 5-500 MG PO TABS: 1.0000 | ORAL_TABLET | Freq: Four times a day (QID) | ORAL | Status: DC | PRN

## 2012-09-03 MED ORDER — CYCLOBENZAPRINE HCL 10 MG PO TABS: 10.0000 mg | ORAL_TABLET | Freq: Two times a day (BID) | ORAL | Status: DC | PRN

## 2012-09-03 NOTE — ED Provider Notes (Signed)
Medical screening examination/treatment/procedure(s) were performed by non-physician practitioner and as supervising physician I was immediately available for consultation/collaboration.    Konnar Ben R Cash Duce, MD 09/03/12 2234 
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
## 2012-09-25 ENCOUNTER — Telehealth: Payer: Self-pay
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
## 2013-02-20 ENCOUNTER — Encounter: Payer: Worker's Compensation | Attending: Physical Medicine & Rehabilitation

## 2013-02-20 ENCOUNTER — Ambulatory Visit (HOSPITAL_BASED_OUTPATIENT_CLINIC_OR_DEPARTMENT_OTHER): Payer: Worker's Compensation | Admitting: Physical Medicine & Rehabilitation

## 2013-02-20 ENCOUNTER — Encounter: Payer: Self-pay | Admitting: Physical Medicine & Rehabilitation

## 2013-02-20 VITALS — BP 158/79 | HR 95 | Resp 14 | Ht 68.0 in | Wt 216.8 lb

## 2013-02-20 DIAGNOSIS — M51379 Other intervertebral disc degeneration, lumbosacral region without mention of lumbar back pain or lower extremity pain: Secondary | ICD-10-CM | POA: Insufficient documentation

## 2013-02-20 DIAGNOSIS — M5137 Other intervertebral disc degeneration, lumbosacral region: Secondary | ICD-10-CM

## 2013-02-20 DIAGNOSIS — G8929 Other chronic pain: Secondary | ICD-10-CM | POA: Insufficient documentation

## 2013-02-20 DIAGNOSIS — M545 Low back pain, unspecified: Secondary | ICD-10-CM | POA: Insufficient documentation

## 2013-02-20 DIAGNOSIS — M79609 Pain in unspecified limb: Secondary | ICD-10-CM | POA: Insufficient documentation

## 2013-02-20 DIAGNOSIS — IMO0002 Reserved for concepts with insufficient information to code with codable children: Secondary | ICD-10-CM

## 2013-02-20 DIAGNOSIS — M47817 Spondylosis without myelopathy or radiculopathy, lumbosacral region: Secondary | ICD-10-CM | POA: Insufficient documentation

## 2013-02-20 DIAGNOSIS — Z79899 Other long term (current) drug therapy: Secondary | ICD-10-CM | POA: Insufficient documentation

## 2013-02-20 MED ORDER — TRAMADOL HCL 50 MG PO TABS: 100.0000 mg | ORAL_TABLET | Freq: Two times a day (BID) | ORAL | Status: DC

## 2013-02-20 MED ORDER — PREGABALIN 150 MG PO CAPS: 150.0000 mg | ORAL_CAPSULE | Freq: Two times a day (BID) | ORAL | Status: DC

## 2013-02-20 NOTE — Patient Instructions (Signed)
Have changed to Lyrica strength from 75-150 mg. This means she will only need to take one tablet twice a day
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
## 2013-08-06 ENCOUNTER — Telehealth: Payer: Self-pay

## 2013-08-06 NOTE — Telephone Encounter (Signed)
Ok to refill 

## 2013-08-06 NOTE — Telephone Encounter (Signed)
Lyrica 150mg  1 capsule bid refill request.

## 2013-08-07 MED ORDER — PREGABALIN 150 MG PO CAPS: 150.0000 mg | ORAL_CAPSULE | Freq: Two times a day (BID) | ORAL | Status: DC

## 2013-08-07 NOTE — Telephone Encounter (Signed)
Lyrica called into [facility]. 

## 2013-08-23 ENCOUNTER — Encounter
Payer: Worker's Compensation | Attending: Physical Medicine and Rehabilitation | Admitting: Physical Medicine and Rehabilitation

## 2013-08-23 ENCOUNTER — Encounter: Payer: Self-pay | Admitting: Physical Medicine and Rehabilitation

## 2013-08-23 VITALS — BP 159/85 | HR 88 | Resp 14 | Ht 68.0 in | Wt 222.0 lb

## 2013-08-23 DIAGNOSIS — M5137 Other intervertebral disc degeneration, lumbosacral region: Secondary | ICD-10-CM | POA: Insufficient documentation

## 2013-08-23 DIAGNOSIS — M542 Cervicalgia: Secondary | ICD-10-CM | POA: Insufficient documentation

## 2013-08-23 DIAGNOSIS — M47817 Spondylosis without myelopathy or radiculopathy, lumbosacral region: Secondary | ICD-10-CM

## 2013-08-23 DIAGNOSIS — M5126 Other intervertebral disc displacement, lumbar region: Secondary | ICD-10-CM | POA: Insufficient documentation

## 2013-08-23 DIAGNOSIS — M51379 Other intervertebral disc degeneration, lumbosacral region without mention of lumbar back pain or lower extremity pain: Secondary | ICD-10-CM | POA: Insufficient documentation

## 2013-08-23 DIAGNOSIS — M25519 Pain in unspecified shoulder: Secondary | ICD-10-CM | POA: Insufficient documentation

## 2013-08-23 DIAGNOSIS — M545 Low back pain: Secondary | ICD-10-CM

## 2013-08-23 DIAGNOSIS — Z79899 Other long term (current) drug therapy: Secondary | ICD-10-CM | POA: Insufficient documentation

## 2013-08-23 MED ORDER — TRAMADOL HCL 50 MG PO TABS: 100.0000 mg | ORAL_TABLET | Freq: Two times a day (BID) | ORAL | Status: DC

## 2013-08-23 NOTE — Patient Instructions (Signed)
Try to do your stretching exercises every day, and continue with your walking regularly. Try to look into aquatic exercises after your peak season at work is over.
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
## 2013-09-11 ENCOUNTER — Telehealth: Payer: Self-pay

## 2013-09-11 NOTE — Telephone Encounter (Signed)
Patient is requesting refills on Lyrica and Tramadol.

## 2013-09-12 NOTE — Telephone Encounter (Signed)
Contacted patient to inform her that she has a 90 day supply of both Lyrica and Tramadol both. Patient said she does not have the printed RX for Tramadol that Clydie Braun gave her at her OV on 11/13. Patient is going to look again and let us know. The Lyrica was called in to the Pharmacy on 10/28.

## 2013-09-24 ENCOUNTER — Telehealth: Payer: Self-pay

## 2013-09-24 MED ORDER — TRAMADOL HCL 50 MG PO TABS: 100.0000 mg | ORAL_TABLET | Freq: Two times a day (BID) | ORAL | Status: DC

## 2013-09-24 NOTE — Telephone Encounter (Signed)
Patient called regarding her rx.  She says she does not remember getting it at her last visit.  She has searched her car and home for it.  She would like to know what to do?  Please advise.

## 2013-09-24 NOTE — Telephone Encounter (Signed)
[facility]  Patient aware.

## 2013-11-28 ENCOUNTER — Other Ambulatory Visit: Payer: Self-pay

## 2013-11-28 MED ORDER — TRAMADOL HCL 50 MG PO TABS: 100.0000 mg | ORAL_TABLET | Freq: Two times a day (BID) | ORAL | Status: DC

## 2013-11-28 MED ORDER — PREGABALIN 150 MG PO CAPS: 150.0000 mg | ORAL_CAPSULE | Freq: Two times a day (BID) | ORAL | Status: DC

## 2013-12-03 ENCOUNTER — Telehealth: Payer: Self-pay

## 2013-12-03 NOTE — Telephone Encounter (Signed)
Received phone call from [facility] regarding patients tramadol and lyrica rx.  Advised them that we had called this into [facility].

## 2014-02-18 ENCOUNTER — Encounter: Payer: Worker's Compensation | Attending: Physical Medicine & Rehabilitation

## 2014-02-18 ENCOUNTER — Ambulatory Visit: Payer: Self-pay | Admitting: Physical Medicine & Rehabilitation

## 2014-03-12 ENCOUNTER — Encounter: Payer: Worker's Compensation | Attending: Physical Medicine & Rehabilitation

## 2014-03-12 ENCOUNTER — Ambulatory Visit (HOSPITAL_BASED_OUTPATIENT_CLINIC_OR_DEPARTMENT_OTHER): Payer: Worker's Compensation | Admitting: Physical Medicine & Rehabilitation

## 2014-03-12 ENCOUNTER — Encounter: Payer: Self-pay | Admitting: Physical Medicine & Rehabilitation

## 2014-03-12 VITALS — BP 141/83 | HR 63 | Resp 14 | Ht 68.0 in | Wt 203.0 lb

## 2014-03-12 DIAGNOSIS — M5137 Other intervertebral disc degeneration, lumbosacral region: Secondary | ICD-10-CM

## 2014-03-12 DIAGNOSIS — M545 Low back pain, unspecified: Secondary | ICD-10-CM | POA: Insufficient documentation

## 2014-03-12 DIAGNOSIS — R109 Unspecified abdominal pain: Secondary | ICD-10-CM | POA: Insufficient documentation

## 2014-03-12 DIAGNOSIS — IMO0002 Reserved for concepts with insufficient information to code with codable children: Secondary | ICD-10-CM | POA: Diagnosis not present

## 2014-03-12 DIAGNOSIS — R209 Unspecified disturbances of skin sensation: Secondary | ICD-10-CM | POA: Diagnosis not present

## 2014-03-12 DIAGNOSIS — M51379 Other intervertebral disc degeneration, lumbosacral region without mention of lumbar back pain or lower extremity pain: Secondary | ICD-10-CM | POA: Insufficient documentation
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
## 2014-03-12 NOTE — Patient Instructions (Addendum)
Because you have a history of serotonin syndrome, I would not recommend taking any antidepressants that does the serotonin levels such as Prozac or Zoloft or Celexa as well as nortriptyline or amitriptyline                    Serotonin Syndrome Serotonin is a brain chemical that regulates the nervous system. Some kinds of drugs increase the amount of serotonin in your body. Drugs that increase the serotonin in your body include:   Anti-depressant medications.  St. John's wort.  Recreational drugs.  Migraine medicines.  Some pain medicines. SYMPTOMS Combining these drugs increases the risk that you will become ill with a toxic condition called serotonin syndrome.  Symptoms of too much serotonin include:  Confusion.  Agitation.  Weakness.  Insomnia.  Fever.  Sweats. Other symptoms that may develop include:  Shakiness.  Muscle spasms.  Seizures. TREATMENT  Hospital treatment is often needed until the effects are controlled.  Avoiding the combination of medicines listed above is recommended.  Check with your doctor if you are concerned about your medicine or the side effects. Document Released: 11/04/2004 Document Revised: 12/20/2011 Document Reviewed: 09/27/2005 [facility] Patient Information 2014 [place], [place].   Increased Lyrica to 3 tablets per day for one week. Call to report whether this is helpful or not  Trochanteric Bursitis You have hip pain due to trochanteric bursitis. Bursitis means that the sack near the outside of the hip is filled with fluid and inflamed. This sack is made up of protective soft tissue. The pain from trochanteric bursitis can be severe and keep you from sleep. It can radiate to the buttocks or down the outside of the thigh to the knee. The pain is almost always worse when rising from the seated or lying position and with walking. Pain can improve after you take a few steps. It happens more often in people with hip joint and lumbar spine  problems, such as arthritis or previous surgery. Very rarely the trochanteric bursa can become infected, and antibiotics and/or surgery may be needed. Treatment often includes an injection of local anesthetic mixed with cortisone medicine. This medicine is injected into the area where it is most tender over the hip. Repeat injections may be necessary if the response to treatment is slow. You can apply ice packs over the tender area for 30 minutes every 2 hours for the next few days. Anti-inflammatory and/or narcotic pain medicine may also be helpful. Limit your activity for the next few days if the pain continues. See your caregiver in 5-10 days if you are not greatly improved.  SEEK IMMEDIATE MEDICAL CARE IF:  You develop severe pain, fever, or increased redness.  You have pain that radiates below the knee. EXERCISES STRETCHING EXERCISES - Trochantic Bursitis  These exercises may help you when beginning to rehabilitate your injury. Your symptoms may resolve with or without further involvement from your physician, physical therapist or athletic trainer. While completing these exercises, remember:   Restoring tissue flexibility helps normal motion to return to the joints. This allows healthier, less painful movement and activity.  An effective stretch should be held for at least 30 seconds.  A stretch should never be painful. You should only feel a gentle lengthening or release in the stretched tissue. STRETCH  Iliotibial Band  On the floor or bed, lie on your side so your injured leg is on top. Bend your knee and grab your ankle.  Slowly bring your knee back so that  your thigh is in line with your trunk. Keep your heel at your buttocks and gently arch your back so your head, shoulders and hips line up.  Slowly lower your leg so that your knee approaches the floor/bed until you feel a gentle stretch on the outside of your thigh. If you do not feel a stretch and your knee will not fall farther,  place the heel of your opposite foot on top of your knee and pull your thigh down farther.  Hold this stretch for __________ seconds.  Repeat __________ times. Complete this exercise __________ times per day. STRETCH Hamstrings, Supine   Lie on your back. Loop a belt or towel over the ball of your foot as shown.  Straighten your knee and slowly pull on the belt to raise your injured leg. Do not allow the knee to bend. Keep your opposite leg flat on the floor.  Raise the leg until you feel a gentle stretch behind your knee or thigh. Hold this position for __________ seconds.  Repeat __________ times. Complete this stretch __________ times per day. STRETCH - Quadriceps, Prone   Lie on your stomach on a firm surface, such as a bed or padded floor.  Bend your knee and grasp your ankle. If you are unable to reach, your ankle or pant leg, use a belt around your foot to lengthen your reach.  Gently pull your heel toward your buttocks. Your knee should not slide out to the side. You should feel a stretch in the front of your thigh and/or knee.  Hold this position for __________ seconds.  Repeat __________ times. Complete this stretch __________ times per day. STRETCHING - Hip Flexors, Lunge Half kneel with your knee on the floor and your opposite knee bent and directly over your ankle.  Keep good posture with your head over your shoulders. Tighten your buttocks to point your tailbone downward; this will prevent your back from arching too much.  You should feel a gentle stretch in the front of your thigh and/or hip. If you do not feel any resistance, slightly slide your opposite foot forward and then slowly lunge forward so your knee once again lines up over your ankle. Be sure your tailbone remains pointed downward.  Hold this stretch for __________ seconds.  Repeat __________ times. Complete this stretch __________ times per day. STRETCH - Adductors, Lunge  While standing, spread your  legs  Lean away from your injured leg by bending your opposite knee. You may rest your hands on your thigh for balance.  You should feel a stretch in your inner thigh. Hold for __________ seconds.  Repeat __________ times. Complete this exercise __________ times per day. Document Released: 11/04/2004 Document Revised: 12/20/2011 Document Reviewed: 01/09/2009 [facility] Patient Information 2014 [place], [place].

## 2014-03-12 NOTE — Progress Notes (Signed)
   Subjective:    Patient ID: Emma Newman, female    DOB: 1961-02-03, 53 y.o.   MRN: [id number]  HPI Pain Inventory Average Pain 5 Pain Right Now 3 My pain is sharp, dull, tingling and aching  In the last 24 hours, has pain interfered with the following? General activity 4 Relation with others 4 Enjoyment of life 5 What TIME of day is your pain at its worst? daytime, evening Sleep (in general) Fair  Pain is worse with: bending, sitting, inactivity and some activites Pain improves with: rest and medication Relief from Meds: 4  Mobility walk without assistance how many minutes can you walk? 15 ability to climb steps?  yes do you drive?  yes transfers alone Do you have any goals in this area?  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  yes  Neuro/Psych weakness numbness tingling spasms  Prior Studies Any changes since last visit?  no  Physicians involved in your care Any changes since last visit?  yes Orthopedist Dr. Nathanial Rancher   Family History  Problem Relation Age of Onset  . Aneurysm Mother 69    died from complications of AAA  . Cancer Mother     breast cancer  . Cancer Father 85    mesothelioma  . Heart disease Father     cardiomyopathy   History   Social History  . Marital Status: Married    Spouse Name: N/A    Number of Children: N/A  . Years of Education: N/A   Social History Main Topics  . Smoking status: Never Smoker   . Smokeless tobacco: Never Used  . Alcohol Use: None  . Drug Use: No  . Sexual Activity: None   Other Topics Concern  . None   Social History Narrative  . None   Past Surgical History  Procedure Laterality Date  . Appendectomy      1981   Past Medical History  Diagnosis Date  . Herniated lumbar intervertebral disc     L4-S1   BP 141/83  Pulse 63  Resp 14  Ht 5\' 8"  (1.727 m)   Wt 203 lb (92.08 kg)  BMI 30.87 kg/m2  SpO2 96%  Opioid Risk Score:   Fall Risk Score: Low Fall Risk (0-5 points) (pt educated on fall risk, brochure given to pt previously)    Review of Systems  Constitutional: Positive for unexpected weight change.  Neurological: Positive for weakness and numbness.       Tingling, spasms  All other systems reviewed and are negative.      Objective:   Physical Exam        Assessment & Plan:

## 2014-05-28 ENCOUNTER — Ambulatory Visit: Payer: Self-pay | Admitting: Physical Medicine & Rehabilitation

## 2014-06-14 ENCOUNTER — Ambulatory Visit: Payer: Self-pay | Admitting: Physical Medicine & Rehabilitation

## 2014-06-25 ENCOUNTER — Ambulatory Visit (HOSPITAL_BASED_OUTPATIENT_CLINIC_OR_DEPARTMENT_OTHER): Payer: Worker's Compensation | Admitting: Physical Medicine & Rehabilitation

## 2014-06-25 ENCOUNTER — Encounter: Payer: Self-pay | Admitting: Physical Medicine & Rehabilitation

## 2014-06-25 ENCOUNTER — Other Ambulatory Visit: Payer: Self-pay

## 2014-06-25 ENCOUNTER — Encounter: Payer: Worker's Compensation | Attending: Physical Medicine & Rehabilitation

## 2014-06-25 VITALS — BP 146/87 | HR 66 | Resp 16 | Ht 68.0 in | Wt 194.0 lb

## 2014-06-25 DIAGNOSIS — M545 Low back pain, unspecified: Secondary | ICD-10-CM | POA: Insufficient documentation

## 2014-06-25 DIAGNOSIS — IMO0002 Reserved for concepts with insufficient information to code with codable children: Secondary | ICD-10-CM | POA: Diagnosis not present

## 2014-06-25 DIAGNOSIS — M5430 Sciatica, unspecified side: Secondary | ICD-10-CM

## 2014-06-25 DIAGNOSIS — M5442 Lumbago with sciatica, left side: Secondary | ICD-10-CM

## 2014-06-25 DIAGNOSIS — M5137 Other intervertebral disc degeneration, lumbosacral region: Secondary | ICD-10-CM | POA: Insufficient documentation

## 2014-06-25 DIAGNOSIS — R209 Unspecified disturbances of skin sensation: Secondary | ICD-10-CM | POA: Insufficient documentation

## 2014-06-25 DIAGNOSIS — R109 Unspecified abdominal pain: Secondary | ICD-10-CM | POA: Diagnosis not present

## 2014-06-25 DIAGNOSIS — M51379 Other intervertebral disc degeneration, lumbosacral region without mention of lumbar back pain or lower extremity pain: Secondary | ICD-10-CM | POA: Insufficient documentation

## 2014-06-25 DIAGNOSIS — M5441 Lumbago with sciatica, right side: Secondary | ICD-10-CM

## 2014-06-25 MED ORDER — PREGABALIN 150 MG PO CAPS: 150.0000 mg | ORAL_CAPSULE | Freq: Two times a day (BID) | ORAL | Status: DC

## 2014-06-25 MED ORDER — TRAMADOL HCL 50 MG PO TABS: 100.0000 mg | ORAL_TABLET | Freq: Two times a day (BID) | ORAL | Status: DC

## 2014-06-25 NOTE — Progress Notes (Signed)
Subjective:    Patient ID: Emma Newman, female    DOB: August 10, 1961, 53 y.o.   MRN: [id number]  HPI Burning sensation on feet at night  Mother had history of neuropathy unclear etiology question chemo related, had some back issues as well, never progressed to [facility] use  Working part time at [employer], no longer lifting and twisting on a repetitive basis, now fueling trucks, patient states that work is not as  busy and demanding and pain increases during work. 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 General activity 2 Relation with others 1 Enjoyment of life 3 What TIME of day is your pain at its worst? evening,night Sleep (in general) Fair  Pain is worse with: sitting, inactivity and some activites Pain improves with: rest, pacing activities and medication Relief from Meds: 7  Mobility walk without assistance how many minutes can you walk? 15 ability to climb steps?  yes do you drive?  yes transfers alone  Function employed # of hrs/week 5-8 Do you have any goals in this area?  no  Neuro/Psych weakness numbness  Prior Studies Any changes since last visit?  no  Physicians involved in your care Any changes since last visit?  no   Family History  Problem Relation Age of Onset  . Aneurysm Mother 50    died from complications of AAA  . Cancer Mother     breast cancer  . Cancer Father 55    mesothelioma  . Heart disease Father     cardiomyopathy   History   Social History  . Marital Status: Married    Spouse Name: N/A    Number of Children: N/A  . Years of Education: N/A   Social History Main Topics  . Smoking status: Never Smoker   . Smokeless tobacco: Never Used  .  Alcohol Use: None  . Drug Use: No  . Sexual Activity: None   Other Topics Concern  . None   Social History Narrative  . None   Past Surgical History  Procedure Laterality Date  . Appendectomy      1981   Past Medical History  Diagnosis Date  . Herniated lumbar intervertebral disc     L4-S1   BP 146/87  Pulse 66  Resp 16  Ht  (1.727 m)  Wt 194 lb (87.998 kg)  BMI 29.50 kg/m2  SpO2 98%  Opioid Risk Score:   Fall Risk Score: Low Fall Risk (0-5 points)   Review of Systems  Constitutional: Positive for diaphoresis.  Cardiovascular: Positive for leg swelling.  Musculoskeletal: Positive for back pain.  Neurological: Positive for weakness and numbness.  All other systems reviewed and are negative.      Objective:   Physical Exam Constitutional: She is oriented to person, place, and time.  Reflex Scores:  Tricep reflexes are 0 on the right side and 0 on the left side.  Bicep reflexes are 0 on the right side and 0 on the left side.  Brachioradialis reflexes are 0 on the right side and 0 on the left side.  Patellar reflexes are 2+ on the right side and 2+ on the left side.  Achilles  reflexes are 1+ on the right side and 1+ on the left side.     Reduced right L5-L3 and S1 dermatomes pinprick sensation    Assessment & Plan:  1. Lumbar degenerative disk and chronic radiculopathy, right S1, continue e1 150 mg twice a day Patient continues with her over-the-counter medications, continue tramadol 100 mg twice a day. History of serotonin syndrome would not produce the dose above this or add another medication that can increase her to her levels2. Neck pain seeing orthopedics for this.

## 2014-11-30 ENCOUNTER — Ambulatory Visit (INDEPENDENT_AMBULATORY_CARE_PROVIDER_SITE_OTHER): Payer: BLUE CROSS/BLUE SHIELD

## 2014-11-30 ENCOUNTER — Encounter: Payer: Self-pay | Admitting: Podiatry

## 2014-11-30 ENCOUNTER — Ambulatory Visit (INDEPENDENT_AMBULATORY_CARE_PROVIDER_SITE_OTHER): Payer: BLUE CROSS/BLUE SHIELD | Admitting: Podiatry

## 2014-11-30 VITALS — BP 125/60 | HR 65 | Resp 18

## 2014-11-30 DIAGNOSIS — M7661 Achilles tendinitis, right leg: Secondary | ICD-10-CM

## 2014-11-30 DIAGNOSIS — R52 Pain, unspecified: Secondary | ICD-10-CM

## 2014-11-30 NOTE — Progress Notes (Signed)
   Subjective:    Patient ID: Emma Newman, female    DOB: 1961-06-14, 54 y.o.   MRN: [id number]  HPI  54 year old female presents the office today with complaints of an injury to the right heel into December 2015. She states that she tripped and since then has had discomfort to the back of her right heel. She states that the area is not painful and she is able to continue daily activities without much discomfort. She states that there is a mild dull ache overlying the area. She continues to walk approximately 1 hour per day and she stands all day at work. She states that she starts to have some discomfort after the end of her exercise or the end of her shift. No other complaints at this time.    Review of Systems  Constitutional: Positive for unexpected weight change.       SWEATING  Musculoskeletal: Positive for back pain.       JOINT PAIN  Neurological: Positive for numbness.  All other systems reviewed and are negative.      Objective:   Physical Exam AAO 3, NAD DP/PT pulses palpable, CRT less than 3 seconds protective sensation intact with Simms Weinstein monofilament, vibratory sensation intact, Achilles tendon reflex intact. On the posterior aspect of the right heel there does appear to be thickening of the Achilles tendon and mild bursa formation at the insertion not of the Achilles tendon onto the calcaneus compared to the contralateral extremity. There is mild discomfort along the course of the Achilles tendon midsubstance and along the insertion into the calcaneus. There does not appear to be any tear or defect palpable. Thompson test was performed and the Achilles tendon is intact. There is no overlying erythema or increase in warmth. No overlying edema. No pain to the contralateral extremity. MMT 5/5, ROM WNL No open lesions or pre-ulcerative lesions identified. No pain with calf compression, swelling, warmth, erythema.        Assessment & Plan:  54 year old female with  Achilles tendinitis, right -X-rays were obtained and reviewed with the patient. -Treatment options were discussed including alternatives, risks, complications. -Discussed stretching/strengthening exercises to help rehabilitate the Achilles tendon. Recommended her to start this on a gradual basis if there is any increasing symptoms while performing his activities decrease activity. -Dispensed a heel lift to wear in the shoe. Discussed her to remove the shoe as symptoms resolve. -She has a prescription for voltaren gel at home and recommended she can continue this for the left foot as well. -Continue anti-inflammatories as needed. -Follow-up in 4 weeks or sooner if any problems are to arise. In the meantime, encouraged call the office with any questions, concerns, change in symptoms.

## 2014-11-30 NOTE — Patient Instructions (Signed)
Achilles Tendinitis   with Rehab  Achilles tendinitis is a disorder of the Achilles tendon. The Achilles tendon connects the large calf muscles (Gastrocnemius and Soleus) to the heel bone (calcaneus). This tendon is sometimes called the heel cord. It is important for pushing-off and standing on your toes and is important for walking, running, or jumping. Tendinitis is often caused by overuse and repetitive microtrauma.  SYMPTOMS  · Pain, tenderness, swelling, warmth, and redness may occur over the Achilles tendon even at rest.  · Pain with pushing off, or flexing or extending the ankle.  · Pain that is worsened after or during activity.  CAUSES   · Overuse sometimes seen with rapid increase in exercise programs or in sports requiring running and jumping.  · Poor physical conditioning (strength and flexibility or endurance).  · Running sports, especially training running down hills.  · Inadequate warm-up before practice or play or failure to stretch before participation.  · Injury to the tendon.  PREVENTION   · Warm up and stretch before practice or competition.  · Allow time for adequate rest and recovery between practices and competition.  · Keep up conditioning.  ¨ Keep up ankle and leg flexibility.  ¨ Improve or keep muscle strength and endurance.  ¨ Improve cardiovascular fitness.  · Use proper technique.  · Use proper equipment (shoes, skates).  · To help prevent recurrence, taping, protective strapping, or an adhesive bandage may be recommended for several weeks after healing is complete.  PROGNOSIS   · Recovery may take weeks to several months to heal.  · Longer recovery is expected if symptoms have been prolonged.  · Recovery is usually quicker if the inflammation is due to a direct blow as compared with overuse or sudden strain.  RELATED COMPLICATIONS   · Healing time will be prolonged if the condition is not correctly treated. The injury must be given plenty of time to heal.  · Symptoms can reoccur if  activity is resumed too soon.  · Untreated, tendinitis may increase the risk of tendon rupture requiring additional time for recovery and possibly surgery.  TREATMENT   · The first treatment consists of rest anti-inflammatory medication, and ice to relieve the pain.  · Stretching and strengthening exercises after resolution of pain will likely help reduce the risk of recurrence. Referral to a physical therapist or athletic trainer for further evaluation and treatment may be helpful.  · A walking boot or cast may be recommended to rest the Achilles tendon. This can help break the cycle of inflammation and microtrauma.  · Arch supports (orthotics) may be prescribed or recommended by your caregiver as an adjunct to therapy and rest.  · Surgery to remove the inflamed tendon lining or degenerated tendon tissue is rarely necessary and has shown less than predictable results.  MEDICATION   · Nonsteroidal anti-inflammatory medications, such as aspirin and ibuprofen, may be used for pain and inflammation relief. Do not take within 7 days before surgery. Take these as directed by your caregiver. Contact your caregiver immediately if any bleeding, stomach upset, or signs of allergic reaction occur. Other minor pain relievers, such as acetaminophen, may also be used.  · Pain relievers may be prescribed as necessary by your caregiver. Do not take prescription pain medication for longer than 4 to 7 days. Use only as directed and only as much as you need.  · Cortisone injections are rarely indicated. Cortisone injections may weaken tendons and predispose to rupture. It is better   to give the condition more time to heal than to use them.  HEAT AND COLD  · Cold is used to relieve pain and reduce inflammation for acute and chronic Achilles tendinitis. Cold should be applied for 10 to 15 minutes every 2 to 3 hours for inflammation and pain and immediately after any activity that aggravates your symptoms. Use ice packs or an ice  massage.  · Heat may be used before performing stretching and strengthening activities prescribed by your caregiver. Use a heat pack or a warm soak.  SEEK MEDICAL CARE IF:  · Symptoms get worse or do not improve in 2 weeks despite treatment.  · New, unexplained symptoms develop. Drugs used in treatment may produce side effects.  EXERCISES  RANGE OF MOTION (ROM) AND STRETCHING EXERCISES - Achilles Tendinitis   These exercises may help you when beginning to rehabilitate your injury. Your symptoms may resolve with or without further involvement from your physician, physical therapist or athletic trainer. While completing these exercises, remember:   · Restoring tissue flexibility helps normal motion to return to the joints. This allows healthier, less painful movement and activity.  · An effective stretch should be held for at least 30 seconds.  · A stretch should never be painful. You should only feel a gentle lengthening or release in the stretched tissue.  STRETCH - Gastroc, Standing   · Place hands on wall.  · Extend right / left leg, keeping the front knee somewhat bent.  · Slightly point your toes inward on your back foot.  · Keeping your right / left heel on the floor and your knee straight, shift your weight toward the wall, not allowing your back to arch.  · You should feel a gentle stretch in the right / left calf. Hold this position for __________ seconds.  Repeat __________ times. Complete this stretch __________ times per day.  STRETCH - Soleus, Standing   · Place hands on wall.  · Extend right / left leg, keeping the other knee somewhat bent.  · Slightly point your toes inward on your back foot.  · Keep your right / left heel on the floor, bend your back knee, and slightly shift your weight over the back leg so that you feel a gentle stretch deep in your back calf.  · Hold this position for __________ seconds.  Repeat __________ times. Complete this stretch __________ times per day.  STRETCH -  Gastrocsoleus, Standing   Note: This exercise can place a lot of stress on your foot and ankle. Please complete this exercise only if specifically instructed by your caregiver.   · Place the ball of your right / left foot on a step, keeping your other foot firmly on the same step.  · Hold on to the wall or a rail for balance.  · Slowly lift your other foot, allowing your body weight to press your heel down over the edge of the step.  · You should feel a stretch in your right / left calf.  · Hold this position for __________ seconds.  · Repeat this exercise with a slight bend in your knee.  Repeat __________ times. Complete this stretch __________ times per day.   STRENGTHENING EXERCISES - Achilles Tendinitis  These exercises may help you when beginning to rehabilitate your injury. They may resolve your symptoms with or without further involvement from your physician, physical therapist or athletic trainer. While completing these exercises, remember:   · Muscles can gain both the endurance   and the strength needed for everyday activities through controlled exercises.  · Complete these exercises as instructed by your physician, physical therapist or athletic trainer. Progress the resistance and repetitions only as guided.  · You may experience muscle soreness or fatigue, but the pain or discomfort you are trying to eliminate should never worsen during these exercises. If this pain does worsen, stop and make certain you are following the directions exactly. If the pain is still present after adjustments, discontinue the exercise until you can discuss the trouble with your clinician.  STRENGTH - Plantar-flexors   · Sit with your right / left leg extended. Holding onto both ends of a rubber exercise band/tubing, loop it around the ball of your foot. Keep a slight tension in the band.  · Slowly push your toes away from you, pointing them downward.  · Hold this position for __________ seconds. Return slowly, controlling the  tension in the band/tubing.  Repeat __________ times. Complete this exercise __________ times per day.   STRENGTH - Plantar-flexors   · Stand with your feet shoulder width apart. Steady yourself with a wall or table using as little support as needed.  · Keeping your weight evenly spread over the width of your feet, rise up on your toes.*  · Hold this position for __________ seconds.  Repeat __________ times. Complete this exercise __________ times per day.   *If this is too easy, shift your weight toward your right / left leg until you feel challenged. Ultimately, you may be asked to do this exercise with your right / left foot only.  STRENGTH - Plantar-flexors, Eccentric   Note: This exercise can place a lot of stress on your foot and ankle. Please complete this exercise only if specifically instructed by your caregiver.   · Place the balls of your feet on a step. With your hands, use only enough support from a wall or rail to keep your balance.  · Keep your knees straight and rise up on your toes.  · Slowly shift your weight entirely to your right / left toes and pick up your opposite foot. Gently and with controlled movement, lower your weight through your right / left foot so that your heel drops below the level of the step. You will feel a slight stretch in the back of your calf at the end position.  · Use the healthy leg to help rise up onto the balls of both feet, then lower weight only on the right / left leg again. Build up to 15 repetitions. Then progress to 3 consecutive sets of 15 repetitions.*  · After completing the above exercise, complete the same exercise with a slight knee bend (about 30 degrees). Again, build up to 15 repetitions. Then progress to 3 consecutive sets of 15 repetitions.*  Perform this exercise __________ times per day.   *When you easily complete 3 sets of 15, your physician, physical therapist or athletic trainer may advise you to add resistance by wearing a backpack filled with  additional weight.  STRENGTH - Plantar Flexors, Seated   · Sit on a chair that allows your feet to rest flat on the ground. If necessary, sit at the edge of the chair.  · Keeping your toes firmly on the ground, lift your right / left heel as far as you can without increasing any discomfort in your ankle.  Repeat __________ times. Complete this exercise __________ times a day.  *If instructed by your physician, physical therapist or athletic   trainer, you may add ____________________ of resistance by placing a weighted object on your right / left knee.  Document Released: 04/28/2005 Document Revised: 12/20/2011 Document Reviewed: 01/09/2009  [employer]® Patient Information ©2015 [employer]. This information is not intended to replace advice given to you by your health care provider. Make sure you discuss any questions you have with your health care provider.

## 2014-12-24 ENCOUNTER — Ambulatory Visit: Payer: BLUE CROSS/BLUE SHIELD | Admitting: Physical Medicine & Rehabilitation

## 2014-12-24 ENCOUNTER — Encounter: Payer: Worker's Compensation | Attending: Physical Medicine & Rehabilitation

## 2014-12-27 ENCOUNTER — Ambulatory Visit: Payer: BLUE CROSS/BLUE SHIELD | Admitting: Podiatry

## 2015-01-17 ENCOUNTER — Ambulatory Visit (INDEPENDENT_AMBULATORY_CARE_PROVIDER_SITE_OTHER): Payer: BLUE CROSS/BLUE SHIELD | Admitting: Podiatry

## 2015-01-17 ENCOUNTER — Encounter: Payer: Self-pay | Admitting: Podiatry

## 2015-01-17 VITALS — BP 145/75 | HR 60 | Resp 12

## 2015-01-17 DIAGNOSIS — M7661 Achilles tendinitis, right leg: Secondary | ICD-10-CM | POA: Diagnosis not present

## 2015-01-17 NOTE — Patient Instructions (Signed)
Achilles Tendinitis   with Rehab  Achilles tendinitis is a disorder of the Achilles tendon. The Achilles tendon connects the large calf muscles (Gastrocnemius and Soleus) to the heel bone (calcaneus). This tendon is sometimes called the heel cord. It is important for pushing-off and standing on your toes and is important for walking, running, or jumping. Tendinitis is often caused by overuse and repetitive microtrauma.  SYMPTOMS  · Pain, tenderness, swelling, warmth, and redness may occur over the Achilles tendon even at rest.  · Pain with pushing off, or flexing or extending the ankle.  · Pain that is worsened after or during activity.  CAUSES   · Overuse sometimes seen with rapid increase in exercise programs or in sports requiring running and jumping.  · Poor physical conditioning (strength and flexibility or endurance).  · Running sports, especially training running down hills.  · Inadequate warm-up before practice or play or failure to stretch before participation.  · Injury to the tendon.  PREVENTION   · Warm up and stretch before practice or competition.  · Allow time for adequate rest and recovery between practices and competition.  · Keep up conditioning.  ¨ Keep up ankle and leg flexibility.  ¨ Improve or keep muscle strength and endurance.  ¨ Improve cardiovascular fitness.  · Use proper technique.  · Use proper equipment (shoes, skates).  · To help prevent recurrence, taping, protective strapping, or an adhesive bandage may be recommended for several weeks after healing is complete.  PROGNOSIS   · Recovery may take weeks to several months to heal.  · Longer recovery is expected if symptoms have been prolonged.  · Recovery is usually quicker if the inflammation is due to a direct blow as compared with overuse or sudden strain.  RELATED COMPLICATIONS   · Healing time will be prolonged if the condition is not correctly treated. The injury must be given plenty of time to heal.  · Symptoms can reoccur if  activity is resumed too soon.  · Untreated, tendinitis may increase the risk of tendon rupture requiring additional time for recovery and possibly surgery.  TREATMENT   · The first treatment consists of rest anti-inflammatory medication, and ice to relieve the pain.  · Stretching and strengthening exercises after resolution of pain will likely help reduce the risk of recurrence. Referral to a physical therapist or athletic trainer for further evaluation and treatment may be helpful.  · A walking boot or cast may be recommended to rest the Achilles tendon. This can help break the cycle of inflammation and microtrauma.  · Arch supports (orthotics) may be prescribed or recommended by your caregiver as an adjunct to therapy and rest.  · Surgery to remove the inflamed tendon lining or degenerated tendon tissue is rarely necessary and has shown less than predictable results.  MEDICATION   · Nonsteroidal anti-inflammatory medications, such as aspirin and ibuprofen, may be used for pain and inflammation relief. Do not take within 7 days before surgery. Take these as directed by your caregiver. Contact your caregiver immediately if any bleeding, stomach upset, or signs of allergic reaction occur. Other minor pain relievers, such as acetaminophen, may also be used.  · Pain relievers may be prescribed as necessary by your caregiver. Do not take prescription pain medication for longer than 4 to 7 days. Use only as directed and only as much as you need.  · Cortisone injections are rarely indicated. Cortisone injections may weaken tendons and predispose to rupture. It is better   to give the condition more time to heal than to use them.  HEAT AND COLD  · Cold is used to relieve pain and reduce inflammation for acute and chronic Achilles tendinitis. Cold should be applied for 10 to 15 minutes every 2 to 3 hours for inflammation and pain and immediately after any activity that aggravates your symptoms. Use ice packs or an ice  massage.  · Heat may be used before performing stretching and strengthening activities prescribed by your caregiver. Use a heat pack or a warm soak.  SEEK MEDICAL CARE IF:  · Symptoms get worse or do not improve in 2 weeks despite treatment.  · New, unexplained symptoms develop. Drugs used in treatment may produce side effects.  EXERCISES  RANGE OF MOTION (ROM) AND STRETCHING EXERCISES - Achilles Tendinitis   These exercises may help you when beginning to rehabilitate your injury. Your symptoms may resolve with or without further involvement from your physician, physical therapist or athletic trainer. While completing these exercises, remember:   · Restoring tissue flexibility helps normal motion to return to the joints. This allows healthier, less painful movement and activity.  · An effective stretch should be held for at least 30 seconds.  · A stretch should never be painful. You should only feel a gentle lengthening or release in the stretched tissue.  STRETCH - Gastroc, Standing   · Place hands on wall.  · Extend right / left leg, keeping the front knee somewhat bent.  · Slightly point your toes inward on your back foot.  · Keeping your right / left heel on the floor and your knee straight, shift your weight toward the wall, not allowing your back to arch.  · You should feel a gentle stretch in the right / left calf. Hold this position for __________ seconds.  Repeat __________ times. Complete this stretch __________ times per day.  STRETCH - Soleus, Standing   · Place hands on wall.  · Extend right / left leg, keeping the other knee somewhat bent.  · Slightly point your toes inward on your back foot.  · Keep your right / left heel on the floor, bend your back knee, and slightly shift your weight over the back leg so that you feel a gentle stretch deep in your back calf.  · Hold this position for __________ seconds.  Repeat __________ times. Complete this stretch __________ times per day.  STRETCH -  Gastrocsoleus, Standing   Note: This exercise can place a lot of stress on your foot and ankle. Please complete this exercise only if specifically instructed by your caregiver.   · Place the ball of your right / left foot on a step, keeping your other foot firmly on the same step.  · Hold on to the wall or a rail for balance.  · Slowly lift your other foot, allowing your body weight to press your heel down over the edge of the step.  · You should feel a stretch in your right / left calf.  · Hold this position for __________ seconds.  · Repeat this exercise with a slight bend in your knee.  Repeat __________ times. Complete this stretch __________ times per day.   STRENGTHENING EXERCISES - Achilles Tendinitis  These exercises may help you when beginning to rehabilitate your injury. They may resolve your symptoms with or without further involvement from your physician, physical therapist or athletic trainer. While completing these exercises, remember:   · Muscles can gain both the endurance   and the strength needed for everyday activities through controlled exercises.  · Complete these exercises as instructed by your physician, physical therapist or athletic trainer. Progress the resistance and repetitions only as guided.  · You may experience muscle soreness or fatigue, but the pain or discomfort you are trying to eliminate should never worsen during these exercises. If this pain does worsen, stop and make certain you are following the directions exactly. If the pain is still present after adjustments, discontinue the exercise until you can discuss the trouble with your clinician.  STRENGTH - Plantar-flexors   · Sit with your right / left leg extended. Holding onto both ends of a rubber exercise band/tubing, loop it around the ball of your foot. Keep a slight tension in the band.  · Slowly push your toes away from you, pointing them downward.  · Hold this position for __________ seconds. Return slowly, controlling the  tension in the band/tubing.  Repeat __________ times. Complete this exercise __________ times per day.   STRENGTH - Plantar-flexors   · Stand with your feet shoulder width apart. Steady yourself with a wall or table using as little support as needed.  · Keeping your weight evenly spread over the width of your feet, rise up on your toes.*  · Hold this position for __________ seconds.  Repeat __________ times. Complete this exercise __________ times per day.   *If this is too easy, shift your weight toward your right / left leg until you feel challenged. Ultimately, you may be asked to do this exercise with your right / left foot only.  STRENGTH - Plantar-flexors, Eccentric   Note: This exercise can place a lot of stress on your foot and ankle. Please complete this exercise only if specifically instructed by your caregiver.   · Place the balls of your feet on a step. With your hands, use only enough support from a wall or rail to keep your balance.  · Keep your knees straight and rise up on your toes.  · Slowly shift your weight entirely to your right / left toes and pick up your opposite foot. Gently and with controlled movement, lower your weight through your right / left foot so that your heel drops below the level of the step. You will feel a slight stretch in the back of your calf at the end position.  · Use the healthy leg to help rise up onto the balls of both feet, then lower weight only on the right / left leg again. Build up to 15 repetitions. Then progress to 3 consecutive sets of 15 repetitions.*  · After completing the above exercise, complete the same exercise with a slight knee bend (about 30 degrees). Again, build up to 15 repetitions. Then progress to 3 consecutive sets of 15 repetitions.*  Perform this exercise __________ times per day.   *When you easily complete 3 sets of 15, your physician, physical therapist or athletic trainer may advise you to add resistance by wearing a backpack filled with  additional weight.  STRENGTH - Plantar Flexors, Seated   · Sit on a chair that allows your feet to rest flat on the ground. If necessary, sit at the edge of the chair.  · Keeping your toes firmly on the ground, lift your right / left heel as far as you can without increasing any discomfort in your ankle.  Repeat __________ times. Complete this exercise __________ times a day.  *If instructed by your physician, physical therapist or athletic   trainer, you may add ____________________ of resistance by placing a weighted object on your right / left knee.  Document Released: 04/28/2005 Document Revised: 12/20/2011 Document Reviewed: 01/09/2009  [employer]® Patient Information ©2015 [employer]. This information is not intended to replace advice given to you by your health care provider. Make sure you discuss any questions you have with your health care provider.

## 2015-01-20 NOTE — Progress Notes (Signed)
Patient ID: Emma Newman, female   DOB: 1961-02-27, 54 y.o.   MRN: [id number]  Subjective: 54 year old female returns the office today for follow-up evaluation of Achilles tendinitis on the right foot. She states that she is doing about the same she did before however she has not been to a stretching exercises and has not had any anti-inflammatories. She said did not help much. Denies any recent injury or trauma. Denies any swelling or redness over the area. No other complaints at this time.  Objective: AAO 3, NAD DP/PT pulses palpable, CRT less than 3 seconds protective sensation intact with Simms once the monofilament, Achilles tendon reflex intact. There is mild tenderness to palpation along the distal aspect of the Achilles tendon on the right side and along the insertion of the calcaneus. There is no overlying edema, erythema, increase in warmth. There is no defect noted within the Achilles tendon Thompson test was performed and was negative. There is no other areas of tenderness to bilateral lower extremities. MMT 5/5, ROM WNL No open lesions or pre-ulcerative lesions No pain with calf compression, swelling, warmth, erythema.  Assessment: 54 year old female with continued Achilles tendinitis, right side  Plan: -Treatment options discussed include alternatives, risks, complications. -Again discussed stretching exercises. -Ice to the area -Dispensed night splint -Discussed shoe gear modification -Follow up in 4 weeks or sooner if any problems are to arise. In the meantime encouraged to call the office with any questions or concerns.

## 2015-01-21 ENCOUNTER — Ambulatory Visit (HOSPITAL_BASED_OUTPATIENT_CLINIC_OR_DEPARTMENT_OTHER): Payer: Worker's Compensation | Admitting: Physical Medicine & Rehabilitation

## 2015-01-21 ENCOUNTER — Encounter: Payer: Worker's Compensation | Attending: Physical Medicine & Rehabilitation

## 2015-01-21 ENCOUNTER — Encounter: Payer: Self-pay | Admitting: Physical Medicine & Rehabilitation

## 2015-01-21 VITALS — BP 121/77 | HR 61 | Resp 14

## 2015-01-21 DIAGNOSIS — M5137 Other intervertebral disc degeneration, lumbosacral region: Secondary | ICD-10-CM | POA: Insufficient documentation

## 2015-01-21 DIAGNOSIS — M5416 Radiculopathy, lumbar region: Secondary | ICD-10-CM

## 2015-01-21 MED ORDER — TRAMADOL HCL 50 MG PO TABS: 100.0000 mg | ORAL_TABLET | Freq: Two times a day (BID) | ORAL | Status: DC

## 2015-01-21 MED ORDER — PREGABALIN 150 MG PO CAPS: 150.0000 mg | ORAL_CAPSULE | Freq: Two times a day (BID) | ORAL | Status: DC

## 2015-01-21 NOTE — Patient Instructions (Signed)
No injection or med changes recommended at this time

## 2015-01-21 NOTE — Progress Notes (Signed)
Subjective:    Patient ID: Emma Newman, female    DOB: 1961/03/01, 54 y.o.   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 General activity 3 Relation with others 4 Enjoyment of life 5 What TIME of day is your pain at its worst? daytime, evening Sleep (in general) Fair  Pain is worse with: bending, sitting, inactivity and some activites Pain improves with: rest, heat/ice, pacing activities and medication Relief from Meds: 7  Mobility walk without assistance how many minutes can you walk? 30-60 ability to climb steps?  yes do you drive?  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  yes  Physicians involved in your care Any changes since last visit?  no   Family History  Problem Relation Age of Onset  . Aneurysm Mother 68    died from complications of AAA  . Cancer Mother     breast cancer  . Cancer Father 43    mesothelioma  . Heart disease Father     cardiomyopathy   History   Social History  . Marital Status: Unknown    Spouse Name: N/A  . Number of Children: N/A  . Years of Education: N/A   Social History Main Topics  . Smoking status: Never Smoker   . Smokeless tobacco: Never Used  . Alcohol Use: Not on file  . Drug Use: No  . Sexual Activity: Not on file   Other Topics Concern  . None   Social History  Narrative   Past Surgical History  Procedure Laterality Date  . Appendectomy      1981   Past Medical History  Diagnosis Date  . Herniated lumbar intervertebral disc     L4-S1   BP 121/77 mmHg  Pulse 61  Resp 14  SpO2 97%  Opioid Risk Score:   Fall Risk Score:  `1  Depression screen PHQ 2/9  Depression screen PHQ 2/9 01/21/2015  Decreased Interest 2  Down, Depressed, Hopeless 1  PHQ - 2 Score 3  Altered sleeping 1  Tired, decreased energy 1  Change in appetite 1  Feeling bad or failure about yourself  0  Trouble concentrating 1  Moving slowly or fidgety/restless 0  Suicidal thoughts 0  PHQ-9 Score 7     Review of Systems  Constitutional: Positive for unexpected weight change.       Night sweats Weight gain/loss  Neurological: Positive for weakness and numbness.  Psychiatric/Behavioral: Positive for dysphoric mood.  All other systems reviewed and are negative.      Objective:   Physical Exam  Constitutional: She is oriented to person, place, and time. She appears well-developed and well-nourished.  HENT:  Head: Normocephalic and atraumatic.  Neurological: She is alert and oriented to person, place, and time. She has normal strength.  Reflex Scores:  Patellar reflexes are 1+ on the right side and 2+ on the left side.      Achilles reflexes are 1+ on the right side and 1+ on the left side. Decreased bilateral L5 and S1 dermatomal distribution sensation.    Psychiatric: She has a normal mood and affect.  Nursing note and vitals reviewed.  Full lumbar flexion and extension lateral bending Ambulates without evidence of toe drag or knee instability       Assessment & Plan:  1. Lumbar degenerative disk and chronic radiculopathy, right S1, continue Lyrica 150 mg twice a day Patient continues with her over-the-counter medications, continue tramadol 100 mg twice a day. History of serotonin syndrome would not produce the dose above this or add another  medication that can increase her to her levels Some decreased sensation left L5-S1 dermatomal distribution but no other changes in exam or symptoms in the last 6 months.  We'll continue to monitor, We discussed if she develops any weakness or balance issues that we probably need to reimage her. If her pain increases to the point where it's inhibiting her activities of daily living rather than just hobbies we may consider repeat epidural injection  Return to clinic 6 months

## 2015-02-04 ENCOUNTER — Telehealth: Payer: Self-pay | Admitting: *Deleted

## 2015-02-04 NOTE — Telephone Encounter (Signed)
Emma Newman called because her mail order has not received the orders for her medication tramadol and lyrica.I called in her tramadol and lyrica to the mail order.  It did not get called in at visit on 01/21/15.  I also called the smaller amount of tramadol [facility] local [phone] to get through until the mail order arrives.

## 2015-02-05 ENCOUNTER — Telehealth: Payer: Self-pay | Admitting: *Deleted

## 2015-02-05 ENCOUNTER — Ambulatory Visit: Payer: BLUE CROSS/BLUE SHIELD | Admitting: Podiatry

## 2015-02-05 MED ORDER — PREGABALIN 150 MG PO CAPS: 150.0000 mg | ORAL_CAPSULE | Freq: Two times a day (BID) | ORAL | Status: DC

## 2015-02-05 NOTE — Telephone Encounter (Signed)
Needs a 2 week supply of Lyrica called [facility].  The have the 2 week supply of tramadol.  I have called this in and notified Ruthanne.

## 2015-03-31 ENCOUNTER — Ambulatory Visit (INDEPENDENT_AMBULATORY_CARE_PROVIDER_SITE_OTHER): Payer: BLUE CROSS/BLUE SHIELD | Admitting: Podiatry

## 2015-03-31 ENCOUNTER — Encounter: Payer: Self-pay | Admitting: Podiatry

## 2015-03-31 VITALS — BP 141/65 | HR 68 | Resp 12

## 2015-03-31 DIAGNOSIS — M2040 Other hammer toe(s) (acquired), unspecified foot: Secondary | ICD-10-CM

## 2015-03-31 DIAGNOSIS — M7661 Achilles tendinitis, right leg: Secondary | ICD-10-CM

## 2015-04-01 ENCOUNTER — Encounter: Payer: Self-pay | Admitting: Podiatry

## 2015-04-01 NOTE — Progress Notes (Signed)
Patient ID: Emma Newman, female   DOB: July 27, 1961, 54 y.o.   MRN: [id number]  Subjective: 54 year old female presents the office today for follow up evaluation of right Achilles tendinitis. She states that she's had some improvement compared to last appointment although not as much as she would like. She states that she continue the stretching, icing activities as well as night splint. She denies any swelling or redness overlying the area. Also at the end of the appointment today she did ask me to look at her hammertoes on her left foot. She states that in more painful over the last year or so particular shoe gear causing irritation on the top of the toes. She's had no prior treatment. No other complaints at this time. No acute changes since last appointment.  Objective: AAO x3, NAD DP/PT pulses palpable bilaterally, CRT less than 3 seconds Protective sensation intact with Simms Weinstein monofilament, vibratory sensation intact, Achilles tendon reflex intact There is continued tenderness on the distal aspect of the right Achilles tendon on the insertion in the calcaneus. There is no pain along the mid substance of the tendon. There is no defect noted in [place] test the performed and was negative. There is mild hammertoe contractures bilaterally with slight irritation on the dorsal aspect of the PIPJ from shoe gear. No other areas of tenderness to bilateral lower extremities. MMT 5/5, ROM WNL.  No open lesions or pre-ulcerative lesions.  No overlying edema, erythema, increase in warmth to bilateral lower extremities.  No pain with calf compression, swelling, warmth, erythema bilaterally.   Assessment: 54 year old female with continued right Achilles tendinitis, hammertoe  Plan: -Treatment options discussed including all alternatives, risks, and complications -At this time as she is not had significant improvement of Achilles tendinitis I discussed with her physical therapy which she is amendable  to. A prescription for PT was given to the patient. -In regards to hammertoes I discussed with her padding, offloading. Continue with orthotics. Discussed with her at the symptoms persist can do surgery in the future if desired. -Follow-up after PT or sooner if any problems are to arise. Call with questions or concerns or any change in symptoms in the meantime.

## 2015-06-18 ENCOUNTER — Other Ambulatory Visit: Payer: Self-pay | Admitting: Orthopedic Surgery

## 2015-06-18 ENCOUNTER — Encounter (HOSPITAL_BASED_OUTPATIENT_CLINIC_OR_DEPARTMENT_OTHER): Payer: Self-pay | Admitting: *Deleted

## 2015-06-19 ENCOUNTER — Encounter (HOSPITAL_BASED_OUTPATIENT_CLINIC_OR_DEPARTMENT_OTHER): Payer: Self-pay | Admitting: Certified Registered"

## 2015-06-19 ENCOUNTER — Ambulatory Visit (HOSPITAL_BASED_OUTPATIENT_CLINIC_OR_DEPARTMENT_OTHER): Payer: BLUE CROSS/BLUE SHIELD | Admitting: Anesthesiology

## 2015-06-19 ENCOUNTER — Ambulatory Visit (HOSPITAL_BASED_OUTPATIENT_CLINIC_OR_DEPARTMENT_OTHER)
Admission: RE | Admit: 2015-06-19 | Discharge: 2015-06-19 | Disposition: A | Discharge status: Home / Self Care | Payer: BLUE CROSS/BLUE SHIELD | Source: Ambulatory Visit | Attending: Orthopedic Surgery | Admitting: Orthopedic Surgery

## 2015-06-19 ENCOUNTER — Encounter (HOSPITAL_BASED_OUTPATIENT_CLINIC_OR_DEPARTMENT_OTHER)
Admission: RE | Disposition: A | Discharge status: Home / Self Care | Payer: Self-pay | Source: Ambulatory Visit | Attending: Orthopedic Surgery

## 2015-06-19 DIAGNOSIS — W228XXA Striking against or struck by other objects, initial encounter: Secondary | ICD-10-CM | POA: Diagnosis not present

## 2015-06-19 DIAGNOSIS — L608 Other nail disorders: Secondary | ICD-10-CM | POA: Diagnosis not present

## 2015-06-19 DIAGNOSIS — S92421B Displaced fracture of distal phalanx of right great toe, initial encounter for open fracture: Secondary | ICD-10-CM | POA: Diagnosis not present

## 2015-06-19 DIAGNOSIS — I10 Essential (primary) hypertension: Secondary | ICD-10-CM | POA: Insufficient documentation

## 2015-06-19 DIAGNOSIS — M1990 Unspecified osteoarthritis, unspecified site: Secondary | ICD-10-CM | POA: Insufficient documentation

## 2015-06-19 DIAGNOSIS — Y998 Other external cause status: Secondary | ICD-10-CM | POA: Diagnosis not present

## 2015-06-19 DIAGNOSIS — Y9389 Activity, other specified: Secondary | ICD-10-CM | POA: Diagnosis not present

## 2015-06-19 DIAGNOSIS — M205X1 Other deformities of toe(s) (acquired), right foot: Secondary | ICD-10-CM | POA: Insufficient documentation

## 2015-06-19 DIAGNOSIS — Y92098 Other place in other non-institutional residence as the place of occurrence of the external cause: Secondary | ICD-10-CM | POA: Diagnosis not present

## 2015-06-19 DIAGNOSIS — S92911A Unspecified fracture of right toe(s), initial encounter for closed fracture: Secondary | ICD-10-CM

## 2015-06-19 DIAGNOSIS — M797 Fibromyalgia: Secondary | ICD-10-CM | POA: Insufficient documentation

## 2015-06-19 DIAGNOSIS — Z882 Allergy status to sulfonamides status: Secondary | ICD-10-CM | POA: Insufficient documentation

## 2015-06-19 DIAGNOSIS — S96121A Laceration of muscle and tendon of long extensor muscle of toe at ankle and foot level, right foot, initial encounter: Secondary | ICD-10-CM | POA: Diagnosis not present

## 2015-06-19 HISTORY — DX: Headache: R51

## 2015-06-19 HISTORY — PX: DIGIT NAIL REMOVAL: SHX5052

## 2015-06-19 HISTORY — PX: ORIF TOE FRACTURE: SHX5032

## 2015-06-19 HISTORY — DX: Headache, unspecified: R51.9

## 2015-06-19 HISTORY — DX: Essential (primary) hypertension: I10

## 2015-06-19 HISTORY — DX: Fibromyalgia: M79.7

## 2015-06-19 LAB — POCT I-STAT, CHEM 8
BUN: 18 mg/dL (ref 6–20)
CALCIUM ION: 1.17 mmol/L (ref 1.12–1.23)
Chloride: 104 mmol/L (ref 101–111)
Creatinine, Ser: 0.9 mg/dL (ref 0.44–1.00)
Glucose, Bld: 97 mg/dL (ref 65–99)
HCT: 43 % (ref 36.0–46.0)
Hemoglobin: 14.6 g/dL (ref 12.0–15.0)
Potassium: 3.9 mmol/L (ref 3.5–5.1)
SODIUM: 141 mmol/L (ref 135–145)
TCO2: 23 mmol/L (ref 0–100)

## 2015-06-19 SURGERY — OPEN REDUCTION INTERNAL FIXATION (ORIF) METATARSAL (TOE) FRACTURE
Anesthesia: General | Site: Toe | Laterality: Right

## 2015-06-19 MED ORDER — DOCUSATE SODIUM 100 MG PO CAPS: 100.0000 mg | ORAL_CAPSULE | Freq: Two times a day (BID) | ORAL | Status: DC

## 2015-06-19 MED ORDER — SENNA 8.6 MG PO TABS: 2.0000 | ORAL_TABLET | Freq: Two times a day (BID) | ORAL | Status: DC

## 2015-06-19 MED ORDER — GLYCOPYRROLATE 0.2 MG/ML IJ SOLN: 0.2000 mg | Freq: Once | INTRAMUSCULAR | Status: DC | PRN

## 2015-06-19 MED ORDER — LACTATED RINGERS IV SOLN
INTRAVENOUS | Status: DC
  Administered 2015-06-19 (×2): via INTRAVENOUS

## 2015-06-19 MED ORDER — BUPIVACAINE-EPINEPHRINE (PF) 0.5% -1:200000 IJ SOLN
INTRAMUSCULAR | Status: DC | PRN
  Administered 2015-06-19: 10 mL

## 2015-06-19 MED ORDER — FENTANYL CITRATE (PF) 100 MCG/2ML IJ SOLN
50.0000 ug | INTRAMUSCULAR | Status: DC | PRN
  Administered 2015-06-19: 25 ug via INTRAVENOUS
  Administered 2015-06-19: 100 ug via INTRAVENOUS

## 2015-06-19 MED ORDER — MIDAZOLAM HCL 2 MG/2ML IJ SOLN
INTRAMUSCULAR | Status: AC
  Filled 2015-06-19: qty 4

## 2015-06-19 MED ORDER — SCOPOLAMINE 1 MG/3DAYS TD PT72: 1.0000 | MEDICATED_PATCH | Freq: Once | TRANSDERMAL | Status: DC | PRN

## 2015-06-19 MED ORDER — CHLORHEXIDINE GLUCONATE 4 % EX LIQD: 60.0000 mL | Freq: Once | CUTANEOUS | Status: DC

## 2015-06-19 MED ORDER — CEFAZOLIN SODIUM-DEXTROSE 2-3 GM-% IV SOLR
2.0000 g | INTRAVENOUS | Status: AC
  Administered 2015-06-19: 2 g via INTRAVENOUS

## 2015-06-19 MED ORDER — ACETAMINOPHEN 325 MG PO TABS: 325.0000 mg | ORAL_TABLET | ORAL | Status: DC | PRN

## 2015-06-19 MED ORDER — HYDROMORPHONE HCL 1 MG/ML IJ SOLN: 0.2500 mg | INTRAMUSCULAR | Status: DC | PRN

## 2015-06-19 MED ORDER — DEXAMETHASONE SODIUM PHOSPHATE 10 MG/ML IJ SOLN
INTRAMUSCULAR | Status: DC | PRN
  Administered 2015-06-19: 10 mg via INTRAVENOUS

## 2015-06-19 MED ORDER — FENTANYL CITRATE (PF) 100 MCG/2ML IJ SOLN
INTRAMUSCULAR | Status: AC
  Filled 2015-06-19: qty 4

## 2015-06-19 MED ORDER — 0.9 % SODIUM CHLORIDE (POUR BTL) OPTIME
TOPICAL | Status: DC | PRN
Start: 2015-06-19 — End: 2015-06-19
  Administered 2015-06-19: 200 mL

## 2015-06-19 MED ORDER — LIDOCAINE HCL (CARDIAC) 20 MG/ML IV SOLN
INTRAVENOUS | Status: DC | PRN
  Administered 2015-06-19: 60 mg via INTRAVENOUS

## 2015-06-19 MED ORDER — MIDAZOLAM HCL 2 MG/2ML IJ SOLN
1.0000 mg | INTRAMUSCULAR | Status: DC | PRN
  Administered 2015-06-19: 2 mg via INTRAVENOUS

## 2015-06-19 MED ORDER — OXYCODONE HCL 5 MG PO TABS: 5.0000 mg | ORAL_TABLET | ORAL | Status: DC | PRN

## 2015-06-19 MED ORDER — OXYCODONE HCL 5 MG PO TABS: 5.0000 mg | ORAL_TABLET | Freq: Once | ORAL | Status: DC | PRN

## 2015-06-19 MED ORDER — OXYCODONE HCL 5 MG/5ML PO SOLN: 5.0000 mg | Freq: Once | ORAL | Status: DC | PRN

## 2015-06-19 MED ORDER — ACETAMINOPHEN 160 MG/5ML PO SOLN: 325.0000 mg | ORAL | Status: DC | PRN

## 2015-06-19 MED ORDER — CEFAZOLIN SODIUM-DEXTROSE 2-3 GM-% IV SOLR
INTRAVENOUS | Status: AC
  Filled 2015-06-19: qty 50

## 2015-06-19 MED ORDER — PROPOFOL 10 MG/ML IV BOLUS
INTRAVENOUS | Status: DC | PRN
  Administered 2015-06-19: 200 mg via INTRAVENOUS

## 2015-06-19 MED ORDER — ONDANSETRON HCL 4 MG/2ML IJ SOLN
INTRAMUSCULAR | Status: DC | PRN
  Administered 2015-06-19: 4 mg via INTRAVENOUS

## 2015-06-19 SURGICAL SUPPLY — 87 items
ANCH SUT 1 SHRT SM RGD INSRTR (Anchor) ×2 IMPLANT
ANCH SUT 2-0 MN NDL DRL PLSTR (Anchor) ×2 IMPLANT
ANCHOR JUGGERKNOT 1.0 1DR 2-0 (Anchor) ×3 IMPLANT
ANCHOR SUT 1.45 SZ 1 SHORT (Anchor) ×3 IMPLANT
BAG DECANTER FOR FLEXI CONT (MISCELLANEOUS) IMPLANT
BANDAGE ESMARK 6X9 LF (GAUZE/BANDAGES/DRESSINGS) ×1 IMPLANT
BLADE SURG 15 STRL LF DISP TIS (BLADE) ×4 IMPLANT
BLADE SURG 15 STRL SS (BLADE) ×8
BNDG CMPR 9X4 STRL LF SNTH (GAUZE/BANDAGES/DRESSINGS)
BNDG CMPR 9X6 STRL LF SNTH (GAUZE/BANDAGES/DRESSINGS) ×2
BNDG COHESIVE 3X5 TAN STRL LF (GAUZE/BANDAGES/DRESSINGS) IMPLANT
BNDG COHESIVE 4X5 TAN STRL (GAUZE/BANDAGES/DRESSINGS) ×4 IMPLANT
BNDG COHESIVE 6X5 TAN STRL LF (GAUZE/BANDAGES/DRESSINGS) IMPLANT
BNDG CONFORM 2 STRL LF (GAUZE/BANDAGES/DRESSINGS) ×4 IMPLANT
BNDG ESMARK 4X9 LF (GAUZE/BANDAGES/DRESSINGS) IMPLANT
BNDG ESMARK 6X9 LF (GAUZE/BANDAGES/DRESSINGS) ×4
BNDG GAUZE ELAST 4 BULKY (GAUZE/BANDAGES/DRESSINGS) IMPLANT
CANISTER SUCT 1200ML W/VALVE (MISCELLANEOUS) ×1 IMPLANT
CAP PIN ORTHO PINK (CAP) ×3 IMPLANT
CHLORAPREP W/TINT 26ML (MISCELLANEOUS) ×1 IMPLANT
COVER BACK TABLE 60X90IN (DRAPES) ×4 IMPLANT
CUFF TOURNIQUET SINGLE 18IN (TOURNIQUET CUFF) IMPLANT
CUFF TOURNIQUET SINGLE 34IN LL (TOURNIQUET CUFF) ×3 IMPLANT
DECANTER SPIKE VIAL GLASS SM (MISCELLANEOUS) IMPLANT
DRAPE EXTREMITY T 121X128X90 (DRAPE) ×4 IMPLANT
DRAPE INCISE IOBAN 66X45 STRL (DRAPES) IMPLANT
DRAPE OEC MINIVIEW 54X84 (DRAPES) ×4 IMPLANT
DRAPE SURG 17X23 STRL (DRAPES) ×4 IMPLANT
DRAPE U-SHAPE 47X51 STRL (DRAPES) ×4 IMPLANT
DRSG MEPITEL 4X7.2 (GAUZE/BANDAGES/DRESSINGS) ×3 IMPLANT
DRSG PAD ABDOMINAL 8X10 ST (GAUZE/BANDAGES/DRESSINGS) ×8 IMPLANT
ELECT REM PT RETURN 9FT ADLT (ELECTROSURGICAL) ×4
ELECTRODE REM PT RTRN 9FT ADLT (ELECTROSURGICAL) ×2 IMPLANT
GAUZE SPONGE 4X4 12PLY STRL (GAUZE/BANDAGES/DRESSINGS) ×4 IMPLANT
GLOVE BIO SURGEON STRL SZ8 (GLOVE) ×4 IMPLANT
GLOVE BIOGEL PI IND STRL 7.0 (GLOVE) ×3 IMPLANT
GLOVE BIOGEL PI IND STRL 8 (GLOVE) ×4 IMPLANT
GLOVE BIOGEL PI INDICATOR 7.0 (GLOVE) ×6
GLOVE BIOGEL PI INDICATOR 8 (GLOVE) ×4
GLOVE ECLIPSE 6.5 STRL STRAW (GLOVE) ×3 IMPLANT
GLOVE ECLIPSE 7.5 STRL STRAW (GLOVE) ×4 IMPLANT
GLOVE EXAM NITRILE MD LF STRL (GLOVE) IMPLANT
GLOVE SURG SS PI 6.5 STRL IVOR (GLOVE) ×3 IMPLANT
GOWN STRL REUS W/ TWL LRG LVL3 (GOWN DISPOSABLE) ×3 IMPLANT
GOWN STRL REUS W/ TWL XL LVL3 (GOWN DISPOSABLE) ×4 IMPLANT
GOWN STRL REUS W/TWL LRG LVL3 (GOWN DISPOSABLE) ×8
GOWN STRL REUS W/TWL XL LVL3 (GOWN DISPOSABLE) ×8
HANDPIECE INTERPULSE COAX TIP (DISPOSABLE)
K-WIRE .062X4 (WIRE) ×3 IMPLANT
NDL SAFETY ECLIPSE 18X1.5 (NEEDLE) IMPLANT
NEEDLE HYPO 18GX1.5 SHARP (NEEDLE)
NEEDLE HYPO 22GX1.5 SAFETY (NEEDLE) ×3 IMPLANT
NS IRRIG 1000ML POUR BTL (IV SOLUTION) ×4 IMPLANT
PACK BASIN DAY SURGERY FS (CUSTOM PROCEDURE TRAY) ×4 IMPLANT
PAD CAST 4YDX4 CTTN HI CHSV (CAST SUPPLIES) ×3 IMPLANT
PADDING CAST ABS 4INX4YD NS (CAST SUPPLIES)
PADDING CAST ABS COTTON 4X4 ST (CAST SUPPLIES) IMPLANT
PADDING CAST COTTON 4X4 STRL (CAST SUPPLIES) ×4
PADDING CAST COTTON 6X4 STRL (CAST SUPPLIES) IMPLANT
PENCIL BUTTON HOLSTER BLD 10FT (ELECTRODE) ×4 IMPLANT
SANITIZER HAND PURELL 535ML FO (MISCELLANEOUS) ×4 IMPLANT
SET HNDPC FAN SPRY TIP SCT (DISPOSABLE) IMPLANT
SHEET MEDIUM DRAPE 40X70 STRL (DRAPES) ×4 IMPLANT
SLEEVE SCD COMPRESS KNEE MED (MISCELLANEOUS) ×4 IMPLANT
SPLINT FAST PLASTER 5X30 (CAST SUPPLIES)
SPLINT PLASTER CAST FAST 5X30 (CAST SUPPLIES) IMPLANT
SPONGE LAP 18X18 X RAY DECT (DISPOSABLE) ×4 IMPLANT
STAPLER VISISTAT 35W (STAPLE) IMPLANT
STOCKINETTE 6  STRL (DRAPES) ×2
STOCKINETTE 6 STRL (DRAPES) ×2 IMPLANT
SUCTION FRAZIER TIP 10 FR DISP (SUCTIONS) ×4 IMPLANT
SUT ETHILON 3 0 PS 1 (SUTURE) ×4 IMPLANT
SUT FIBERWIRE #2 38 T-5 BLUE (SUTURE)
SUT MNCRL AB 3-0 PS2 18 (SUTURE) ×3 IMPLANT
SUT VIC AB 0 SH 27 (SUTURE) IMPLANT
SUT VIC AB 2-0 SH 27 (SUTURE)
SUT VIC AB 2-0 SH 27XBRD (SUTURE) IMPLANT
SUT VICRYL 3-0 CR8 SH (SUTURE) IMPLANT
SUTURE FIBERWR #2 38 T-5 BLUE (SUTURE) IMPLANT
SYR 20CC LL (SYRINGE) IMPLANT
SYR 5ML LL (SYRINGE) IMPLANT
SYR BULB 3OZ (MISCELLANEOUS) ×4 IMPLANT
SYR CONTROL 10ML LL (SYRINGE) ×3 IMPLANT
TOWEL OR 17X24 6PK STRL BLUE (TOWEL DISPOSABLE) ×5 IMPLANT
TUBE CONNECTING 20'X1/4 (TUBING)
TUBE CONNECTING 20X1/4 (TUBING) ×1 IMPLANT
UNDERPAD 30X30 (UNDERPADS AND DIAPERS) ×4 IMPLANT

## 2015-06-19 NOTE — Brief Op Note (Signed)
06/19/2015  11:44 AM  PATIENT:  Emma Newman  54 y.o. female  PRE-OPERATIVE DIAGNOSIS:  Right hallux open fracture with mallet toe  POST-OPERATIVE DIAGNOSIS:  Right hallux distal phalanx fx with mallet toe  Procedure(s): 1.  Removal of right hallux nail plate   2.  Repair of extensor hallucis longus tendon to distal phalanx  SURGEON:  Toni Arthurs, MD  ASSISTANT: Alfredo Martinez, PA-C  ANESTHESIA:   General  EBL:  minimal   TOURNIQUET:   Total Tourniquet Time Documented: Thigh (Right) - 41 minutes Total: Thigh (Right) - 41 minutes  COMPLICATIONS:  None apparent  DISPOSITION:  Extubated, awake and stable to recovery.  DICTATION ID:  [id number]

## 2015-06-19 NOTE — Anesthesia Procedure Notes (Signed)
Procedure Name: LMA Insertion Date/Time: 06/19/2015 10:45 AM Performed by: Ian Cavey D Pre-anesthesia Checklist: Patient identified, Emergency Drugs available, Suction available and Patient being monitored Patient Re-evaluated:Patient Re-evaluated prior to inductionOxygen Delivery Method: Circle System Utilized Preoxygenation: Pre-oxygenation with 100% oxygen Intubation Type: IV induction Ventilation: Mask ventilation without difficulty LMA: LMA inserted LMA Size: 4.0 Number of attempts: 1 Airway Equipment and Method: Bite block Placement Confirmation: positive ETCO2 Tube secured with: Tape Dental Injury: Teeth and Oropharynx as per pre-operative assessment

## 2015-06-19 NOTE — Anesthesia Postprocedure Evaluation (Signed)
  Anesthesia Post-op Note  Patient: Emma Newman  Procedure(s) Performed: Procedure(s): OPEN REDUCTION INTERNAL FIXATION (ORIF) RIGHT DISTAL METATARSAL (TOE) FRACTURE (Right) RIGHT HALLUX IRRIGATION AND DEBRIDEMENT OF AN OPEN FRACTURE (Right)  Patient Location: PACU  Anesthesia Type:General  Level of Consciousness: awake  Airway and Oxygen Therapy: Patient Spontanous Breathing  Post-op Pain: mild  Post-op Assessment: Post-op Vital signs reviewed, Patient's Cardiovascular Status Stable, Respiratory Function Stable, Patent Airway, No signs of Nausea or vomiting and Pain level controlled     RLE Motor Response: Purposeful movement        Post-op Vital Signs: Reviewed and stable  Last Vitals:  Filed Vitals:   06/19/15 1200  BP: 147/76  Pulse: 60  Temp:   Resp: 12    Complications: No apparent anesthesia complications

## 2015-06-19 NOTE — Transfer of Care (Signed)
Immediate Anesthesia Transfer of Care Note  Patient: Jaretzy B Interrante  Procedure(s) Performed: Procedure(s): OPEN REDUCTION INTERNAL FIXATION (ORIF) RIGHT DISTAL METATARSAL (TOE) FRACTURE (Right) RIGHT HALLUX IRRIGATION AND DEBRIDEMENT OF AN OPEN FRACTURE (Right)  Patient Location: PACU  Anesthesia Type:General  Level of Consciousness: awake, alert , oriented and patient cooperative  Airway & Oxygen Therapy: Patient Spontanous Breathing and Patient connected to face mask oxygen  Post-op Assessment: Report given to RN and Post -op Vital signs reviewed and stable  Post vital signs: Reviewed and stable  Last Vitals:  Filed Vitals:   06/19/15 0957  BP: 149/78  Pulse: 53  Temp: 36.9 C  Resp: 18    Complications: No apparent anesthesia complications

## 2015-06-19 NOTE — Anesthesia Preprocedure Evaluation (Addendum)
Anesthesia Evaluation  Patient identified by MRN, date of birth, ID band Patient awake    Reviewed: Allergy & Precautions, NPO status , Patient's Chart, lab work & pertinent test results  History of Anesthesia Complications Negative for: history of anesthetic complications  Airway Mallampati: II  TM Distance: >3 FB Neck ROM: Full    Dental  (+) Teeth Intact   Pulmonary    breath sounds clear to auscultation       Cardiovascular hypertension, Pt. on medications  Rhythm:Regular     Neuro/Psych  Headaches,  Neuromuscular disease    GI/Hepatic negative GI ROS, Neg liver ROS,   Endo/Other  negative endocrine ROS  Renal/GU negative Renal ROS     Musculoskeletal  (+) Arthritis , Fibromyalgia -  Abdominal   Peds  Hematology negative hematology ROS (+)   Anesthesia Other Findings   Reproductive/Obstetrics                            Anesthesia Physical Anesthesia Plan  ASA: II  Anesthesia Plan: General   Post-op Pain Management:    Induction: Intravenous  Airway Management Planned: LMA  Additional Equipment: None  Intra-op Plan:   Post-operative Plan: Extubation in OR  Informed Consent: I have reviewed the patients History and Physical, chart, labs and discussed the procedure including the risks, benefits and alternatives for the proposed anesthesia with the patient or authorized representative who has indicated his/her understanding and acceptance.   Dental advisory given  Plan Discussed with: CRNA and Surgeon  Anesthesia Plan Comments:         Anesthesia Quick Evaluation

## 2015-06-19 NOTE — H&P (Signed)
Emma Newman is an 54 y.o. female.   Chief Complaint: right hallux injury HPI:  54 y/o female without significant pmh injured her right foot a couple of days ago when she got the big toe caught in the left of her pajama pants.  She then stumbled striking the toe on the ground.  She saw Dr. Penni Bombard in the office and underwent irrigation of the wound.  SHe was found on xrays to have a mallet toe fracture.  She denies f/c/n/v/changes in appetite.  She c/o aching pain in the toe that is worse with motion and better with rest and elevation.  Past Medical History  Diagnosis Date  . Herniated lumbar intervertebral disc     L4-S1  . Hypertension   . Headache   . Fibromyalgia     Past Surgical History  Procedure Laterality Date  . Appendectomy      1981  . Tonsillectomy      Family History  Problem Relation Age of Onset  . Aneurysm Mother 39    died from complications of AAA  . Cancer Mother     breast cancer  . Cancer Father 74    mesothelioma  . Heart disease Father     cardiomyopathy   Social History:  reports that she has never smoked. She has never used smokeless tobacco. She reports that she drinks alcohol. She reports that she does not use illicit drugs.  Allergies:  Allergies  Allergen Reactions  . Sulfa Antibiotics Other (See Comments)    BURNING SENSATIONS REDNESS    Medications Prior to Admission  Medication Sig Dispense Refill  . ALPRAZolam (XANAX) 0.25 MG tablet   0  . co-enzyme Q-10 30 MG capsule Take 30 mg by mouth daily.    . fluticasone (FLONASE) 50 MCG/ACT nasal spray     . ibuprofen (ADVIL,MOTRIN) 200 MG tablet Take 200 mg by mouth daily.    Marland Kitchen losartan (COZAAR) 100 MG tablet Take 100 mg by mouth every morning.     Marland Kitchen oxyCODONE-acetaminophen (PERCOCET/ROXICET) 5-325 MG per tablet Take by mouth every 4 (four) hours as needed for severe pain.    . pregabalin (LYRICA) 150 MG capsule Take 1 capsule (150 mg total) by mouth 2 (two) times daily. 180 capsule 1  .  rosuvastatin (CRESTOR) 5 MG tablet Take 5 mg by mouth daily.    . traMADol (ULTRAM) 50 MG tablet Take 2 tablets (100 mg total) by mouth 2 (two) times daily. 360 tablet 1  . SUMAtriptan (IMITREX) 100 MG tablet       No results found for this or any previous visit (from the past 48 hour(s)). No results found.  ROS  No recent f/c/n/v/wt loss  Blood pressure 149/78, pulse 53, temperature 98.4 F (36.9 C), temperature source Oral, resp. rate 18, height 5\' 8"  (1.727 m), weight 85.276 kg (188 lb), last menstrual period 07/05/2012, SpO2 99 %. Physical Exam  wn wd woman in nad.  A and O x 4.  Mood and affect normal.  EOMI.  Resp unlabored.  R hallux with laceration at the nail bed beneath the eponychial fold.  No erythema.  Nail plate intact.  Sens to LT intact at the toe.  No lymphadenopathy.  Extension lag present without significant active DF.  Assessment/Plan R hallux open distal phalanx fracture and mallet toe injury - to OR for I and D of open fracture and repair of mallet toe injury.  The risks and benefits of the alternative treatment options  have been discussed in detail.  The patient wishes to proceed with surgery and specifically understands risks of bleeding, infection, nerve damage, blood clots, need for additional surgery, amputation and death.   HEWITTJonny Ruiz Jul 02, 2015, 10:07 AM

## 2015-06-19 NOTE — Discharge Instructions (Addendum)
Toni Arthurs, MD [facility]  Please read the following information regarding your care after surgery.  Medications  You only need a prescription for the narcotic pain medicine (ex. oxycodone, Percocet, Norco).  All of the other medicines listed below are available over the counter. X acetominophen (Tylenol) 650 mg every 4-6 hours as you need for minor pain X oxycodone as prescribed for moderate to severe pain ?   Narcotic pain medicine (ex. oxycodone, Percocet, Vicodin) will cause constipation.  To prevent this problem, take the following medicines while you are taking any pain medicine. X docusate sodium (Colace) 100 mg twice a day X senna (Senokot) 2 tablets twice a day  Weight Bearing X Bear weight when you are able on your operated leg or foot in the flat post-op shoe.   Cast / Splint / Dressing X Remove your dressing 3 days after surgery and cover the incisions with dry dressings.    After your dressing, cast or splint is removed; you may shower, but do not soak or scrub the wound.  Allow the water to run over it, and then gently pat it dry.  Swelling It is normal for you to have swelling where you had surgery.  To reduce swelling and pain, keep your toes above your nose for at least 3 days after surgery.  It may be necessary to keep your foot or leg elevated for several weeks.  If it hurts, it should be elevated.  Follow Up Call my office at [phone] when you are discharged from the hospital or surgery center to schedule an appointment to be seen two weeks after surgery.  Call my office at [phone] if you develop a fever >101.5 F, nausea, vomiting, bleeding from the surgical site or severe pain.     Post Anesthesia Home Care Instructions  Activity: Get plenty of rest for the remainder of the day. A responsible adult should stay with you for 24 hours following the procedure.  For the next 24 hours, DO NOT: -Drive a car -Advertising copywriter -Drink  alcoholic beverages -Take any medication unless instructed by your physician -Make any legal decisions or sign important papers.  Meals: Start with liquid foods such as gelatin or soup. Progress to regular foods as tolerated. Avoid greasy, spicy, heavy foods. If nausea and/or vomiting occur, drink only clear liquids until the nausea and/or vomiting subsides. Call your physician if vomiting continues.  Special Instructions/Symptoms: Your throat may feel dry or sore from the anesthesia or the breathing tube placed in your throat during surgery. If this causes discomfort, gargle with warm salt water. The discomfort should disappear within 24 hours.  If you had a scopolamine patch placed behind your ear for the management of post- operative nausea and/or vomiting:  1. The medication in the patch is effective for 72 hours, after which it should be removed.  Wrap patch in a tissue and discard in the trash. Wash hands thoroughly with soap and water. 2. You may remove the patch earlier than 72 hours if you experience unpleasant side effects which may include dry mouth, dizziness or visual disturbances. 3. Avoid touching the patch. Wash your hands with soap and water after contact with the patch.    Call your surgeon if you experience:   1.  Fever over 101.0. 2.  Inability to urinate. 3.  Nausea and/or vomiting. 4.  Extreme swelling or bruising at the surgical site. 5.  Continued bleeding from the incision. 6.  Increased pain, redness or drainage from  the incision. 7.  Problems related to your pain medication. 8. Any change in color, movement and/or sensation 9. Any problems and/or concerns

## 2015-06-20 ENCOUNTER — Encounter (HOSPITAL_BASED_OUTPATIENT_CLINIC_OR_DEPARTMENT_OTHER): Payer: Self-pay | Admitting: Orthopedic Surgery

## 2015-06-20 NOTE — Op Note (Signed)
NAMESEQUOYA, HOGSETT                  ACCOUNT NO.:  [id number]  MEDICAL RECORD NO.:  [id number]  LOCATION:                                 FACILITY:  PHYSICIAN:  Toni Arthurs, MD        DATE OF BIRTH:  1960-12-15  DATE OF PROCEDURE:  06/19/2015 DATE OF DISCHARGE:                              OPERATIVE REPORT   PREOPERATIVE DIAGNOSIS:  Right hallux open fracture of the distal phalanx with a mallet toe injury.  POSTOPERATIVE DIAGNOSIS:  Right hallux open fracture of the distal phalanx with a mallet toe injury.  PROCEDURES: 1. Right hallux distal phalanx fracture with mallet toe injury. 2. Removal of right hallux nail plate. 3. Repair of extensor hallucis longus tendon to the distal phalanx.  SURGEON:  Toni Arthurs, M.D.  ASSISTANT:  Alfredo Martinez, PA-C.  ANESTHESIA:  General.  ESTIMATED BLOOD LOSS:  Minimal.  TOURNIQUET TIME:  41 minutes at 250 mmHg.  COMPLICATIONS:  None apparent.  DISPOSITION:  Extubated, awake, and stable to recovery.  INDICATIONS FOR PROCEDURE:  The patient is a 54 year old woman without significant past medical history.  She injured her right hallux at home approximately 2 days ago.  She was found in clinic to have the appearance of open fracture as well as mallet toe injury with avulsion of the extensor hallucis longus from the distal phalanx.  She presents now for irrigation and debridement of the open hallux fracture as well as ORIF of the fracture or repair of the tendon back to bone.  She understands the risks and benefits, the alternative treatment options, and elects surgical treatment.  She specifically understands risks of bleeding, infection, nerve damage, blood clots, need for additional surgery, continued pain, recurrence of deformity, amputation, and death.  PROCEDURE IN DETAIL:  After preoperative consent was obtained and the correct operative site was identified, the patient was brought to the operating room and placed supine on the  operating table.  General anesthesia was induced.  Preoperative antibiotics were administered. Surgical time-out was taken.  The right lower extremity was prepped and draped in standard sterile fashion with tourniquet around the left thigh.  The extremity was exsanguinated and tourniquet was inflated to 250 mmHg.  The nail bed was then carefully inspected.  At the eponychial fold proximally, there was evidence of traumatic injury with contusion and soft tissue ecchymosis.  The appearance was that of a nail bed injury with open fracture at the site of the mallet toe.  Incisions were made from the corners of the nail folds proximally, allowing the nail fold to be reflected, exposing the nail.  The nail was then separated from the nail bed with a Therapist, nutritional and removed in its entirety. The nail bed was carefully inspected.  There was no evidence of laceration.  The eponychial folds appeared generally healthy as well.  Attention was then turned to the fracture site.  A longitudinal incision was made over the extensor hallucis longus at the level of the interphalangeal joint.  The EHL was noted to be avulsed from the distal phalanx with only a few small fragments of bone attached to the tendon. The wound was  irrigated and cleaned of all loose fragments of bone at the level of the joint.  A 1-0 JuggerKnot anchor from [employer] was then inserted into the distal phalanx at the site of the avulsion fracture. The extensor hallucis longus was then repaired back to the distal phalanx with horizontal mattress suture of #1 MaxBraid.  The hallux was then maximally dorsiflexed at the level of the interphalangeal joint.  A 0.0625 K-wire was inserted through the tip of the toe and across the IP joint, holding this reduced position.  Pin was bent, trimmed, and capped.  AP and lateral radiographs confirmed appropriate reduction of the fracture site and appropriate position of the guide pin.  The wound was  then irrigated copiously.  The skin incisions were closed with horizontal mattress sutures of 3-0 nylon.  Sterile dressings were applied followed by compression wrap.  Tourniquet was released at approximately 41 minutes.  The patient was awakened from anesthesia and transported to the recovery room in stable condition.  A 0.5% Marcaine with epinephrine was used to perform a digital block for postoperative pain control.  FOLLOWUP PLAN:  The patient will be weightbearing as tolerated on her right foot in a flat postop shoe or Cam walker boot.  She will follow up with me in 2 weeks for suture removal.  RADIOGRAPHS:  AP and lateral radiographs of the right hallux were obtained intraoperatively.  These show interval reduction and fixation of the distal phalanx fracture.  Guide pin is noted across the IP joint and is appropriately positioned.  Alfredo Martinez, PA-C, was present and scrubbed for the duration of the case.  His assistance was essential in positioning the patient, prepping and draping, gaining and maintaining exposure, performing the operation, closing and dressing the wounds, and applying the compressive wrap.     Toni Arthurs, MD     JH/MEDQ  D:  06/19/2015  T:  06/20/2015  Job:  [id number]

## 2015-06-30 ENCOUNTER — Encounter: Payer: Self-pay | Admitting: Podiatry

## 2015-06-30 ENCOUNTER — Ambulatory Visit (INDEPENDENT_AMBULATORY_CARE_PROVIDER_SITE_OTHER): Payer: BLUE CROSS/BLUE SHIELD | Admitting: Podiatry

## 2015-06-30 VITALS — BP 130/71 | HR 93 | Resp 18

## 2015-06-30 DIAGNOSIS — M7661 Achilles tendinitis, right leg: Secondary | ICD-10-CM | POA: Diagnosis not present

## 2015-06-30 DIAGNOSIS — L600 Ingrowing nail: Secondary | ICD-10-CM

## 2015-06-30 NOTE — Progress Notes (Signed)
Patient ID: Emma Newman, female   DOB: 09-20-1961, 54 y.o.   MRN: [id number]  Subjective: 54 year old female presents the office concerns of painful ingrowing of the left second digit toenail on both the medial and lateral nail borders. She states the area becomes painful particularly with pressure in shoe gear. She denies any recent redness or drainage or purulence. She elected to go ahead and proceed with partial nail avulsions of this toenail. She also made the appointment for Achilles tendinitis to the right side however since she made the appointment she did have a fracture to her hallux which was opened and she underwent ORIF with orthopedic surgery. She has been in the boot because of this and this helps the pain to her heel. She were to hold off on any further treatment for that at this time. No other complaints at this time and no other acute changes.  Objective: AAO 3, NAD DP/PT pulses palpable, CRT less than 3 seconds Protective sensation appears to be intact with Emma Newman monofilament The left second digit toenails incurvated on both the medial and lateral nail borders with tenderness to palpation overlying the area. There is no swelling erythema, edema, drainage/purulence. The remaining nails are somewhat incurvated however there not painfulfor any redness or drainage. There is mild tenderness to palpation over the posterior aspect of the Achilles tendon on the insertion of the calcaneus. There is a prominent retrocalcaneal exostosis present. No defect is noted and Emma Newman test is negative. There is a bandage overlying the hallux and a K wire seen at a trade in the distal portion of the toe. There does not appear to be any redness or drainage from around the K wire. The bandage was left intact for this exam. Will defer to orthopedic surgery. No other open lesions or pre-ulcer lesions identified this time. No pain with calf compression, swelling, warmth,  erythema.  Assessment: 54 year old female symptomatic ingrown toenail left second digit toenail; Achilles tendinitis  Plan: -Treatment options discussed including all alternatives, risks, and complications -The bandage was left intact for the ORIF site. Will defer to orthopedic surgery. -Continue Cam Walker as well for the Achilles tendinitis. She would discuss surgery if symptoms continue after the first the year. Now continue icing. She can start to stretch the area once cleared from her other surgery. -At this time, the patient is requesting partial nail removal with chemical matricectomy to the symptomatic portion of the nail. Risks and complications were discussed with the patient for which they understand and  verbally consent to the procedure. Under sterile conditions a total of 3 mL of a mixture of 2% lidocaine plain and 0.5% Marcaine plain was infiltrated in a digital block fashion. Once anesthetized, the skin was prepped in sterile fashion. A tourniquet was then applied. Next the medial and lateral aspect of hallux nail border was then sharply excised making sure to remove the entire offending nail border. Once the nails were ensured to be removed area was debrided and the underlying skin was intact. There is no purulence identified in the procedure. Next phenol was then applied under standard conditions and copiously irrigated. Silvadene was applied. A dry sterile dressing was applied. After application of the dressing the tourniquet was removed and there is found to be an immediate capillary refill time to the digit. The patient tolerated the procedure well any complications. Post procedure instructions were discussed the patient for which he verbally understood. Follow-up in one week for nail check or sooner if any  problems are to arise. Discussed signs/symptoms of infection and directed to call the office immediately should any occur or go directly to the emergency room. In the meantime,  encouraged to call the office with any questions, concerns, changes symptoms.  Ovid Curd, DPM

## 2015-06-30 NOTE — Patient Instructions (Addendum)

## 2015-07-07 ENCOUNTER — Encounter: Payer: Self-pay | Admitting: Podiatry

## 2015-07-07 ENCOUNTER — Ambulatory Visit (INDEPENDENT_AMBULATORY_CARE_PROVIDER_SITE_OTHER): Payer: BLUE CROSS/BLUE SHIELD | Admitting: Podiatry

## 2015-07-07 DIAGNOSIS — L600 Ingrowing nail: Secondary | ICD-10-CM

## 2015-07-07 DIAGNOSIS — Z9889 Other specified postprocedural states: Secondary | ICD-10-CM

## 2015-07-07 NOTE — Patient Instructions (Signed)

## 2015-07-07 NOTE — Progress Notes (Signed)
Patient ID: Emma Newman, female   DOB: April 08, 1961, 54 y.o.   MRN: [id number]  Subjective: 54 year old female presents the office today one-week status post left second digit partial nail avulsion along both the medial and lateral nail borders. She does continue sig her foot in Epson salt soaks cover with antibiotic ointment and a bandage. She denies E drainage or purulence. Denies any surrounding redness or drainage. She gets some occasional discomfort with pressure however it does appear to be improved. No other complaints at this time in no acute changes since last appointment. She denies any systemic complaints as fevers, chills, nausea, vomiting. No calf pain, chest pain, shortness of breath.  Objective: AAO 3, NAD Neurovascular status intact and unchanged status post both medial and lateral partial nail avulsions of the left second digit toenail. There is granulation tissue within the nail bed and the small scab is formed. There is no tenderness to palpation over at this time. There is no drainage or purulence. No swelling erythema or ascending saline as. No fractures or crepitus. No malodor. Cam boot intact the right foot from recent surgery with Dr. Victorino Dike. No past calf compression, swelling, warmth, erythema bilaterally.  Assessment: 54 year old female status post left second digit partial nail avulsion, doing well  Plan: Continue soaking in epsom salts twice a day followed by antibiotic ointment and a band-aid. Can leave uncovered at night. Continue this until completely healed.  If the area has not healed in 2 weeks, call the office for follow-up appointment, or sooner if any problems arise.  Monitor for any signs/symptoms of infection. Call the office immediately if any occur or go directly to the emergency room. Call with any questions/concerns.  Ovid Curd, DPM

## 2015-07-24 ENCOUNTER — Encounter: Payer: Self-pay | Admitting: Physical Medicine & Rehabilitation

## 2015-07-24 ENCOUNTER — Encounter: Payer: Worker's Compensation | Attending: Physical Medicine & Rehabilitation

## 2015-07-24 ENCOUNTER — Ambulatory Visit (HOSPITAL_BASED_OUTPATIENT_CLINIC_OR_DEPARTMENT_OTHER): Payer: Worker's Compensation | Admitting: Physical Medicine & Rehabilitation

## 2015-07-24 VITALS — BP 167/96 | HR 68

## 2015-07-24 DIAGNOSIS — M5137 Other intervertebral disc degeneration, lumbosacral region: Secondary | ICD-10-CM | POA: Diagnosis not present

## 2015-07-24 DIAGNOSIS — Z79899 Other long term (current) drug therapy: Secondary | ICD-10-CM | POA: Diagnosis not present

## 2015-07-24 DIAGNOSIS — I10 Essential (primary) hypertension: Secondary | ICD-10-CM | POA: Diagnosis not present

## 2015-07-24 DIAGNOSIS — M5416 Radiculopathy, lumbar region: Secondary | ICD-10-CM | POA: Insufficient documentation

## 2015-07-24 DIAGNOSIS — R51 Headache: Secondary | ICD-10-CM | POA: Diagnosis not present

## 2015-07-24 DIAGNOSIS — M797 Fibromyalgia: Secondary | ICD-10-CM | POA: Diagnosis not present

## 2015-07-24 MED ORDER — PREGABALIN 150 MG PO CAPS: 150.0000 mg | ORAL_CAPSULE | Freq: Two times a day (BID) | ORAL | Status: DC

## 2015-07-24 MED ORDER — TRAMADOL HCL 50 MG PO TABS: 100.0000 mg | ORAL_TABLET | Freq: Two times a day (BID) | ORAL | Status: DC

## 2015-07-24 NOTE — Progress Notes (Signed)
Subjective:    Patient ID: Emma Newman, female    DOB: 1961-05-22, 54 y.o.   MRN: [id number]  HPI Patient had a fall trying to put on pajamas Denies any sedation from her medications. We discussed her history of certain tone and syndrome, no current issues with the tramadol. This was when her PCP placed her on Celexa in addition to her tramadol. Continues to take Lyrica  Interval medical hx displaced Great toe PIP fx, seen by Ortho, no nail bed damage. Underwent Perc Pin placement. Patient has been noticing increasing pain shooting down her right leg. She also has pain going down her left leg She has been more sedentary sitting more since her fracture. She is in a Personal assistant. She plans to follow-up with the orthopedic surgeon next week. She is hoping to have the percutaneous pin removed. She is off of Work completely      Pain Inventory Average Pain 3 Pain Right Now 2 My pain is constant, dull and aching  In the last 24 hours, has pain interfered with the following? General activity 4 Relation with others 2 Enjoyment of life 1 What TIME of day is your pain at its worst? evening Sleep (in general) Fair  Pain is worse with: bending, inactivity and some activites Pain improves with: medication Relief from Meds: 7  Mobility walk without assistance how many minutes can you walk? 20 ability to climb steps?  yes do you drive?  yes Do you have any goals in this area?  yes  Function employed # of hrs/week 25  Neuro/Psych weakness numbness tingling trouble walking  Prior Studies Any changes since last visit?  no  Physicians involved in your care Any changes since last visit?  no   Family History  Problem Relation Age of Onset  . Aneurysm Mother 6    died from complications of AAA  . Cancer Mother     breast cancer  . Cancer Father 49    mesothelioma  . Heart disease Father     cardiomyopathy   Social History   Social History  . Marital Status:  Unknown    Spouse Name: N/A  . Number of Children: N/A  . Years of Education: N/A   Social History Main Topics  . Smoking status: Never Smoker   . Smokeless tobacco: Never Used  . Alcohol Use: Yes     Comment: social  . Drug Use: No  . Sexual Activity: Not Asked   Other Topics Concern  . None   Social History Narrative   Past Surgical History  Procedure Laterality Date  . Appendectomy      1981  . Tonsillectomy    . Orif toe fracture Right 06/19/2015    Procedure: Repair extensor  hallucus longus tendon to distal phalanx;  Surgeon: Toni Arthurs, MD;  Location: [facility];  Service: Orthopedics;  Laterality: Right;  . Digit nail removal Right 06/19/2015    Procedure: Removal right hallux nail plate;  Surgeon: Toni Arthurs, MD;  Location: [facility];  Service: Orthopedics;  Laterality: Right;   Past Medical History  Diagnosis Date  . Herniated lumbar intervertebral disc     L4-S1  . Hypertension   . Headache   . Fibromyalgia    BP 167/96 mmHg  Pulse 68  SpO2 98%  LMP 07/05/2012  Opioid Risk Score:   Fall Risk Score:  `1  Depression screen PHQ 2/9  Depression screen [facility] 2/9 01/21/2015  Decreased Interest 2  Down, Depressed, Hopeless 1  PHQ - 2 Score 3  Altered sleeping 1  Tired, decreased energy 1  Change in appetite 1  Feeling bad or failure about yourself  0  Trouble concentrating 1  Moving slowly or fidgety/restless 0  Suicidal thoughts 0  PHQ-9 Score 7    Review of Systems  Constitutional: Positive for appetite change.      Objective:   Physical Exam  Constitutional: She is oriented to person, place, and time. She appears well-developed and well-nourished.  HENT:  Head: Normocephalic and atraumatic.  Eyes: Conjunctivae and EOM are normal. Pupils are equal, round, and reactive to light.  Neck: Normal range of motion.  Neurological: She is alert and oriented to person, place, and time.  Psychiatric: She has a normal mood  and affect.  Nursing note and vitals reviewed.   Has dressing over Right forefoot, Protruding percutaneous pin right great toe Difficult to assess sensation secondary to dressing.There is no evidence of ankle swelling. Patient does have diminished sensation at the plantar surface of the right foot Negative straight leg raising 5/5 in bilateral hip flexors knee extensors For the right ankle dorsiflexor, EHL not tested secondary to percutaneous pin No evidence of swelling in the left foot. She has good toe and ankle range of motion.    Assessment & Plan:  1. Lumbar degenerative disk and chronic radiculopathy, right S1, continue Lyrica 150 mg twice a day Patient continues with her over-the-counter medications, continue tramadol 100 mg twice a day. History of serotonin syndrome would not produce the dose above this or add another medication that can increase her to her levels  Some exacerbation of Right > Left  LE radicular sx, This is most likely due to change in activity increase sitting and Decreased standing and walking. She has benefited from S1 injections in the past. We'll schedule for bilateral S1 injection under fluoroscopic guidance.  No further imaging needed at this point. If symptoms change or intensify may consider.  Discussed with patient agrees with plan

## 2015-07-24 NOTE — Patient Instructions (Signed)
Need a driver for injection

## 2015-08-04 ENCOUNTER — Encounter: Payer: Self-pay | Admitting: Physical Medicine & Rehabilitation

## 2015-08-04 ENCOUNTER — Ambulatory Visit (HOSPITAL_BASED_OUTPATIENT_CLINIC_OR_DEPARTMENT_OTHER): Payer: Worker's Compensation | Admitting: Physical Medicine & Rehabilitation

## 2015-08-04 VITALS — BP 129/65 | HR 60 | Resp 14

## 2015-08-04 DIAGNOSIS — M5416 Radiculopathy, lumbar region: Secondary | ICD-10-CM

## 2015-08-04 NOTE — Progress Notes (Signed)
  PROCEDURE RECORD [facility]   Name: Emma Newman DOB:1961/05/07 MRN: [id number]  Date:08/04/2015  Physician: Claudette LawsAndrew Kirsteins, MD    Nurse/CMA: Purvis SheffieldKen Cailan Antonucci  Allergies:  Allergies  Allergen Reactions  . Sulfa Antibiotics Other (See Comments)    BURNING SENSATIONS REDNESS    Consent Signed: Yes.    Is patient diabetic? No.  CBG today?   Pregnant: No. LMP: Patient's last menstrual period was 07/05/2012. (age 54-55)  Anticoagulants: no Anti-inflammatory: no Antibiotics: no  Procedure: bilateral S1 transforaminal epidural steroid injection Position: Prone Start Time: 11:10am  End Time: 11:16am Fluoro Time: 19  RN/CMA Teresa CoombsKen Aslynn Brunetti Ken Chrisy Hillebrand    Time 1045 am 1120am    BP 129/65 149/76    Pulse 60 61    Respirations 14 14    O2 Sat 99 99    S/S 6 6    Pain Level 4/10 4/10     D/C home with father in law, patient A & O X 3, D/C instructions reviewed, and sits independently.

## 2015-08-04 NOTE — Progress Notes (Signed)
Bilateral S1 transforaminal epidural steroid injection under fluoroscopic guidance  Indication: Lumbosacral radiculitis is not relieved by medication management or other conservative care and interfering with self-care and mobility. Prior good relief with right S1 transforaminal epidural injection now with bilateral symptoms. Last injection was Greater than 2 years ago  Informed consent was obtained after describing risk and benefits of the procedure with the patient, this includes bleeding, bruising, infection, paralysis and medication side effects.  The patient wishes to proceed and has given written consent.  Patient was placed in prone position.  The lumbar area was marked and prepped with Betadine.  It was entered with a 25-gauge 1-1/2 inch needle and one mL of 1% lidocaine was injected into the skin and subcutaneous tissue.  Then a 22-gauge 3.5inch spinal needle was inserted into the Left intervertebral foramen under AP, lateral, and oblique view.  Then a solution containing one mL of 10 mg per mL dexamethasone and 2 mL of 1% lidocaine was injected.  The same procedure using identical technique and equipment was performed on the right side.The patient tolerated procedure well.  Post procedure instructions were given.  Please see post procedure form.

## 2015-08-04 NOTE — Patient Instructions (Addendum)

## 2015-08-22 ENCOUNTER — Ambulatory Visit: Payer: Self-pay | Admitting: Physical Medicine & Rehabilitation

## 2015-09-15 ENCOUNTER — Encounter: Payer: Worker's Compensation | Attending: Physical Medicine & Rehabilitation

## 2015-09-15 ENCOUNTER — Ambulatory Visit (HOSPITAL_BASED_OUTPATIENT_CLINIC_OR_DEPARTMENT_OTHER): Payer: Worker's Compensation | Admitting: Physical Medicine & Rehabilitation

## 2015-09-15 ENCOUNTER — Encounter: Payer: Self-pay | Admitting: Physical Medicine & Rehabilitation

## 2015-09-15 VITALS — BP 143/87 | HR 83 | Resp 14

## 2015-09-15 DIAGNOSIS — R51 Headache: Secondary | ICD-10-CM | POA: Insufficient documentation

## 2015-09-15 DIAGNOSIS — Z79899 Other long term (current) drug therapy: Secondary | ICD-10-CM | POA: Insufficient documentation

## 2015-09-15 DIAGNOSIS — M5137 Other intervertebral disc degeneration, lumbosacral region: Secondary | ICD-10-CM | POA: Insufficient documentation

## 2015-09-15 DIAGNOSIS — M5416 Radiculopathy, lumbar region: Secondary | ICD-10-CM

## 2015-09-15 DIAGNOSIS — M797 Fibromyalgia: Secondary | ICD-10-CM | POA: Insufficient documentation

## 2015-09-15 DIAGNOSIS — I10 Essential (primary) hypertension: Secondary | ICD-10-CM | POA: Insufficient documentation

## 2015-09-15 NOTE — Patient Instructions (Signed)
Lumbosacral Radiculopathy °Lumbosacral radiculopathy is a condition that involves the spinal nerves and nerve roots in the low back and bottom of the spine. The condition develops when these nerves and nerve roots move out of place or become inflamed and cause symptoms. °CAUSES °This condition may be caused by: °· Pressure from a disk that bulges out of place (herniated disk). A disk is a plate of cartilage that separates bones in the spine. °· Disk degeneration. °· A narrowing of the bones of the lower back (spinal stenosis). °· A tumor. °· An infection. °· An injury that places sudden pressure on the disks that cushion the bones of your lower spine. °RISK FACTORS °This condition is more likely to develop in: °· Males aged 30-50 years. °· Females aged 50-60 years. °· People who lift improperly. °· People who are overweight or live a sedentary lifestyle. °· People who smoke. °· People who perform repetitive activities that strain the spine. °SYMPTOMS °Symptoms of this condition include: °· Pain that goes down from the back into the legs (sciatica). This is the most common symptom. The pain may be worse with sitting, coughing, or sneezing. °· Pain and numbness in the arms and legs. °· Muscle weakness. °· Tingling. °· Loss of bladder control or bowel control. °DIAGNOSIS °This condition is diagnosed with a physical exam and medical history. If the pain is lasting, you may have tests, such as: °· MRI scan. °· X-ray. °· CT scan. °· Myelogram. °· Nerve conduction study. °TREATMENT °This condition is often treated with: °· Hot packs and ice applied to affected areas. °· Stretches to improve flexibility. °· Exercises to strengthen back muscles. °· Physical therapy. °· Pain medicine. °· A steroid injection in the spine. °In some cases, no treatment is needed. If the condition is long-lasting (chronic), or if symptoms are severe, treatment may involve surgery or lifestyle changes, such as following a weight loss plan. °HOME  CARE INSTRUCTIONS °Medicines °· Take medicines only as directed by your health care provider. °· Do not drive or operate heavy machinery while taking pain medicine. °Injury Care °· Apply a heat pack to the injured area as directed by your health care provider. °· Apply ice to the affected area: °¨ Put ice in a plastic bag. °¨ Place a towel between your skin and the bag. °¨ Leave the ice on for 20-30 minutes, every 2 hours while you are awake or as needed. Or, leave the ice on for as long as directed by your health care provider. °Other Instructions °· If you were shown how to do any exercises or stretches, do them as directed by your health care provider. °· If your health care provider prescribed a diet or exercise program, follow it as directed. °· Keep all follow-up visits as directed by your health care provider. This is important. °SEEK MEDICAL CARE IF: °· Your pain does not improve over time even when taking pain medicines. °SEEK IMMEDIATE MEDICAL CARE IF: °· Your develop severe pain. °· Your pain suddenly gets worse. °· You develop increasing weakness in your legs. °· You lose the ability to control your bladder or bowel. °· You have difficulty walking or balancing. °· You have a fever. °  °This information is not intended to replace advice given to you by your health care provider. Make sure you discuss any questions you have with your health care provider. °  °Document Released: 09/27/2005 Document Revised: 02/11/2015 Document Reviewed: 09/23/2014 °Elsevier Interactive Patient Education ©2016 [employer] ° °

## 2015-09-15 NOTE — Progress Notes (Signed)
Subjective:    Patient ID: Emma Newman, female    DOB: 31-May-1961, 54 y.o.   MRN: [id number]  HPI 10/24  Right S1, ESI , good relief on the Left and fair relief on the right  Pain Inventory Average Pain 2 Pain Right Now na My pain is dull and tingling  In the last 24 hours, has pain interfered with the following? General activity 5 Relation with others 2 Enjoyment of life 2 What TIME of day is your pain at its worst? daytime and night Sleep (in general) Fair  Pain is worse with: inactivity and some activites Pain improves with: rest, therapy/exercise, pacing activities, medication and injections Relief from Meds: na  Mobility walk without assistance how many minutes can you walk? 15-20 ability to climb steps?  yes do you drive?  yes Do you have any goals in this area?  yes  Function employed # of hrs/week 25 Do you have any goals in this area?  yes  Neuro/Psych weakness numbness spasms  Prior Studies Any changes since last visit?  no  Physicians involved in your care Any changes since last visit?  no   Family History  Problem Relation Age of Onset  . Aneurysm Mother 62    died from complications of AAA  . Cancer Mother     breast cancer  . Cancer Father 86    mesothelioma  . Heart disease Father     cardiomyopathy   Social History   Social History  . Marital Status: Unknown    Spouse Name: N/A  . Number of Children: N/A  . Years of Education: N/A   Social History Main Topics  . Smoking status: Never Smoker   . Smokeless tobacco: Never Used  . Alcohol Use: Yes     Comment: social  . Drug Use: No  . Sexual Activity: Not Asked   Other Topics Concern  . None   Social History Narrative   Past Surgical History  Procedure Laterality Date  . Appendectomy      1981  . Tonsillectomy    . Orif toe fracture Right 06/19/2015    Procedure: Repair extensor  hallucus longus tendon to distal phalanx;  Surgeon: Toni Arthurs, MD;  Location: [facility];  Service: Orthopedics;  Laterality: Right;  . Digit nail removal Right 06/19/2015    Procedure: Removal right hallux nail plate;  Surgeon: Toni Arthurs, MD;  Location: [facility];  Service: Orthopedics;  Laterality: Right;   Past Medical History  Diagnosis Date  . Herniated lumbar intervertebral disc     L4-S1  . Hypertension   . Headache   . Fibromyalgia    BP 143/87 mmHg  Pulse 83  Resp 14  SpO2 97%  LMP 07/05/2012  Opioid Risk Score:   Fall Risk Score:  `1  Depression screen PHQ 2/9  Depression screen PHQ 2/9 01/21/2015  Decreased Interest 2  Down, Depressed, Hopeless 1  PHQ - 2 Score 3  Altered sleeping 1  Tired, decreased energy 1  Change in appetite 1  Feeling bad or failure about yourself  0  Trouble concentrating 1  Moving slowly or fidgety/restless 0  Suicidal thoughts 0  PHQ-9 Score 7     Review of Systems  Constitutional: Positive for appetite change and unexpected weight change.  Neurological: Positive for weakness and numbness.       Spasms  All other systems reviewed and are negative.      Objective:  Physical Exam  Constitutional: She appears well-developed and well-nourished.  HENT:  Head: Normocephalic and atraumatic.  Eyes: Conjunctivae and EOM are normal. Pupils are equal, round, and reactive to light.  Lower extremities without evidence of edema no evidence of atrophy in the foot intrinsic muscles. There is healed surgical incision on the dorsum of the right great toe. She is status post toenail removal right great toe. She has decreased sensation to pinprick in the right fifth toe. Remainder of toes have good sensation to pinprick  Motor strength is 5/5 bilateral hip flexor and knee extensor ankle dorsiflexors right great toe motion is impeded by history of Surgical pinning Negative straight leg raising Ambulates without evidence of toe drag or knee instability. Gen. No acute distress       Assessment &  Plan:  1. Bilateral S1 radiculopathy history of degenerative disc L4-5 L5-S1.The left-sided S1 symptoms have improved after epidural,, the right-sided R somewhat improved. Patient  feels like she needs another right-sided S1 injection at this point.I agree with this assessment. We'll continue Lyrica 150 g twice a day Continue tramadol 50 mg, 2 tablets twice a day We'll repeat right S1 injection in 4 weeks

## 2015-09-19 ENCOUNTER — Ambulatory Visit (INDEPENDENT_AMBULATORY_CARE_PROVIDER_SITE_OTHER): Payer: BLUE CROSS/BLUE SHIELD | Admitting: Podiatry

## 2015-09-19 ENCOUNTER — Encounter: Payer: Self-pay | Admitting: Podiatry

## 2015-09-19 ENCOUNTER — Ambulatory Visit (INDEPENDENT_AMBULATORY_CARE_PROVIDER_SITE_OTHER): Payer: BLUE CROSS/BLUE SHIELD

## 2015-09-19 DIAGNOSIS — M722 Plantar fascial fibromatosis: Secondary | ICD-10-CM

## 2015-09-19 DIAGNOSIS — M8430XA Stress fracture, unspecified site, initial encounter for fracture: Secondary | ICD-10-CM | POA: Diagnosis not present

## 2015-09-19 MED ORDER — DICLOFENAC SODIUM 75 MG PO TBEC: 75.0000 mg | DELAYED_RELEASE_TABLET | Freq: Two times a day (BID) | ORAL | Status: DC

## 2015-09-19 NOTE — Progress Notes (Signed)
   Subjective:    Patient ID: Emma Newman, female    DOB: August 05, 1961, [age] y.o.   MRN: [id number]  HPI   54 year old female presents the office concerns of pain about her left heel is been ongoing for approximately one week. She states that she has pain especially with pressure. She has tried a night for without any relief. Denies any recent injury or trauma. No change increase in activity. No swelling or redness. No tingling or numbness. The pain does not wake her up at night. No other complaints at this time.  Review of Systems  Musculoskeletal: Positive for gait problem.       Objective:   Physical Exam General: AAO x3, NAD  Dermatological: Skin is warm, dry and supple bilateral. Nails x 10 are well manicured; remaining integument appears unremarkable at this time. There are no open sores, no preulcerative lesions, no rash or signs of infection present.  Vascular: Dorsalis Pedis artery and Posterior Tibial artery pedal pulses are 2/4 bilateral with immedate capillary fill time. Pedal hair growth present. No varicosities and no lower extremity edema present bilateral. There is no pain with calf compression, swelling, warmth, erythema.   Neruologic: Grossly intact via light touch bilateral. Vibratory intact via tuning fork bilateral. Protective threshold with Semmes Wienstein monofilament intact to all pedal sites bilateral. Patellar and Achilles deep tendon reflexes 2+ bilateral. No Babinski or clonus noted bilateral.   Musculoskeletal: Tenderness to palpation along the plantar medial tubercle of the calcaneus at the insertion of plantar fascia on the left foot. There is no pain along the course of the plantar fascia within the arch of the foot. Plantar fascia appears to be intact. There is no pain with lateral compression of the calcaneus or pain with vibratory sensation. There is no pain along the course or insertion of the achilles tendon. No other areas of tenderness to bilateral lower  extremities. Muscular strength 5/5 in all groups tested bilateral.  Gait: Unassisted, Nonantalgic.      Assessment & Plan:   54 year old female with left heel pain , likely plantar fasciitis but cannot rule out stress fracture. -Treatment options discussed including all alternatives, risks, and complications -X-rays were obtained and reviewed with the patient.  -at this time I recommend immobilization in a CAM boot. She states that she just return to work and she cannot work in a Designer, [employer] boot and she needs to work. I recommended a CAM boot but she states that she cannot due to this time but will wear it when she is not working.  If the pain continues that she is to wear the CAM boot. Dispensed plantar fascial brace to wear when working. Stretching icing exercises. Prescribed voltaren. Discussed side effects of the medication and directed to stop if any are to occur and call the office.  -Follow-up as scheduled or sooner if any problems arise. In the meantime, encouraged to call the office with any questions, concerns, change in symptoms.   Ovid CurdMatthew Hasna Stefanik, DPM

## 2015-10-07 ENCOUNTER — Ambulatory Visit (INDEPENDENT_AMBULATORY_CARE_PROVIDER_SITE_OTHER): Payer: BLUE CROSS/BLUE SHIELD | Admitting: Podiatry

## 2015-10-07 ENCOUNTER — Encounter: Payer: Self-pay | Admitting: Podiatry

## 2015-10-07 VITALS — BP 131/74 | HR 86 | Resp 18

## 2015-10-07 DIAGNOSIS — M722 Plantar fascial fibromatosis: Secondary | ICD-10-CM | POA: Diagnosis not present

## 2015-10-09 NOTE — Progress Notes (Signed)
Patient ID: Emma Newman, female   DOB: 1961/03/24, 54 y.o.   MRN: [id number]  Subjective: Emma Newman presents to the office today for follow-up evaluation of left heel pain. They state that they are doing much better. They have been icing, stretching, try to wear supportive shoe as much as possible. She states that overall her foot is much better than it was recently. She does continue gets some throbbing pain in the bottom of her heel but she says the pain that she is having around the edges of the heel as complete a subsided. She is able to wear regular shoe without any problems injury pain is in the morning and she first gets up after periods of inactivity which she first stands. The pain is relieved by ambulation.   No other complaints at this time. No acute changes since last appointment. They deny any systemic complaints such as fevers, chills, nausea, vomiting.  Objective: General: AAO x3, NAD  Dermatological: Skin is warm, dry and supple bilateral. Nails x 10 are well manicured; remaining integument appears unremarkable at this time. There are no open sores, no preulcerative lesions, no rash or signs of infection present.  Vascular: Dorsalis Pedis artery and Posterior Tibial artery pedal pulses are 2/4 bilateral with immedate capillary fill time. Pedal hair growth present. There is no pain with calf compression, swelling, warmth, erythema.   Neruologic: Grossly intact via light touch bilateral. Vibratory intact via tuning fork bilateral. Protective threshold with Semmes Wienstein monofilament intact to all pedal sites bilateral.   Musculoskeletal: There is mild continued tenderness palpation along the plantar medial tubercle of the calcaneus at the insertion of the plantar fascia on the left foot. There is  no pain along the course of the plantar fascia within the arch of the foot. Plantar fascia appears to be intact bilaterally. There is no pain with lateral compression of the calcaneus and  there is no pain with vibratory sensation. There is no pain along the course or insertion of the Achilles tendon. There are no other areas of tenderness to bilateral lower extremities. No gross boney pedal deformities bilateral. No pain, crepitus, or limitation noted with foot and ankle range of motion bilateral. Muscular strength 5/5 in all groups tested bilateral.  Gait: Unassisted, Nonantalgic.   Assessment: Presents for follow-up evaluation for heel pain, likely plantar fasciitis   Plan: -Treatment options discussed including all alternatives, risks, and complications -Patient elects to proceed with steroid injection into the left heel. Under sterile skin preparation, a total of 2.5cc of kenalog 10, 0.5% Marcaine plain, and 2% lidocaine plain were infiltrated into the symptomatic area without complication. A band-aid was applied. Patient tolerated the injection well without complication. Post-injection care with discussed with the patient. Discussed with the patient to ice the area over the next couple of days to help prevent a steroid flare.  -At this point think stress fracture unlikely given her symptoms. -Ice and stretching exercises on a daily basis. -Continue supportive shoe gear. -Follow-up as scheduled or sooner if any problems arise. In the meantime, encouraged to call the office with any questions, concerns, change in symptoms.   Ovid CurdMatthew Adeleigh Barletta, DPM

## 2015-10-20 ENCOUNTER — Ambulatory Visit: Payer: Worker's Compensation | Admitting: Physical Medicine & Rehabilitation

## 2015-10-21 ENCOUNTER — Encounter: Payer: Self-pay | Admitting: Physical Medicine & Rehabilitation

## 2015-10-21 ENCOUNTER — Ambulatory Visit (HOSPITAL_BASED_OUTPATIENT_CLINIC_OR_DEPARTMENT_OTHER): Payer: Worker's Compensation | Admitting: Physical Medicine & Rehabilitation

## 2015-10-21 ENCOUNTER — Encounter: Payer: Worker's Compensation | Attending: Physical Medicine & Rehabilitation

## 2015-10-21 DIAGNOSIS — M797 Fibromyalgia: Secondary | ICD-10-CM | POA: Diagnosis not present

## 2015-10-21 DIAGNOSIS — R51 Headache: Secondary | ICD-10-CM | POA: Insufficient documentation

## 2015-10-21 DIAGNOSIS — M5416 Radiculopathy, lumbar region: Secondary | ICD-10-CM

## 2015-10-21 DIAGNOSIS — I10 Essential (primary) hypertension: Secondary | ICD-10-CM | POA: Diagnosis not present

## 2015-10-21 DIAGNOSIS — Z79899 Other long term (current) drug therapy: Secondary | ICD-10-CM | POA: Diagnosis not present

## 2015-10-21 DIAGNOSIS — M5137 Other intervertebral disc degeneration, lumbosacral region: Secondary | ICD-10-CM | POA: Diagnosis not present

## 2015-10-21 NOTE — Progress Notes (Signed)
  PROCEDURE RECORD [facility]   Name: Marlyne BeardsSinja B Mabin DOB:05-Jun-1961 MRN: [id number]  Date:10/21/2015  Physician: Claudette LawsAndrew Kirsteins, MD    Nurse/CMA:Shumaker RN  Allergies:  Allergies  Allergen Reactions  . Sulfa Antibiotics Other (See Comments)    BURNING SENSATIONS REDNESS    Consent Signed: Yes.    Is patient diabetic? No.  CBG today?   Pregnant: No. LMP: Patient's last menstrual period was 07/05/2012. (age 55-55)  Anticoagulants: no Anti-inflammatory: no Antibiotics: no  Procedure: right S 1 Position: Prone Start Time: 11:25 End Time: 11:28 Fluoro Time:13 sec  RN/CMA Designer, multimediahumaker RN Shumaker RN    Time  11:35    BP  147/81    Pulse  75    Respirations  16    O2 Sat  98    S/S  6    Pain Level  3     D/C home with father in law, patient A & O X 3, D/C instructions reviewed, and sits independently.

## 2015-10-21 NOTE — Patient Instructions (Signed)

## 2015-10-21 NOTE — Progress Notes (Signed)
Right S1 transforaminal epidural steroid injection under fluoroscopic guidance  Indication: Lumbosacral radiculitis is not relieved by medication management or other conservative care and interfering with self-care and mobility.   Informed consent was obtained after describing risk and benefits of the procedure with the patient, this includes bleeding, bruising, infection, paralysis and medication side effects.  The patient wishes to proceed and has given written consent.  Patient was placed in prone position.  The lumbar area was marked and prepped with Betadine.  It was entered with a 25-gauge 1-1/2 inch needle and one mL of 1% lidocaine was injected into the skin and subcutaneous tissue.  Then a 22-gauge 3.5 in spinal needle was inserted into the Right S1 sacral  foramen under AP, lateral, and oblique view.  Then a solution containing one mL of 10 mg per mL dexamethasone and 2 mL of 1% lidocaine was injected.  The patient tolerated procedure well.  Post procedure instructions were given.  Please see post procedure form.

## 2015-10-22 ENCOUNTER — Telehealth: Payer: Self-pay | Admitting: *Deleted

## 2015-10-22 NOTE — Telephone Encounter (Signed)
Left message with Ms Finkler's mobile number that she left without getting her work note and we were calling to see if there was a fax number we could send it to for her.  Asked to return my call.

## 2015-10-23 NOTE — Telephone Encounter (Signed)
Attempted to reach MS Hammett a second time about letter. No answer.  I will place in mail today.

## 2015-11-04 ENCOUNTER — Ambulatory Visit: Payer: BLUE CROSS/BLUE SHIELD | Admitting: Podiatry

## 2016-01-19 ENCOUNTER — Ambulatory Visit: Payer: Self-pay | Admitting: Physical Medicine & Rehabilitation

## 2016-01-27 ENCOUNTER — Ambulatory Visit (HOSPITAL_BASED_OUTPATIENT_CLINIC_OR_DEPARTMENT_OTHER): Payer: Worker's Compensation | Admitting: Physical Medicine & Rehabilitation

## 2016-01-27 ENCOUNTER — Encounter: Payer: Worker's Compensation | Attending: Physical Medicine & Rehabilitation

## 2016-01-27 ENCOUNTER — Encounter: Payer: Self-pay | Admitting: Physical Medicine & Rehabilitation

## 2016-01-27 VITALS — BP 140/67 | HR 62 | Resp 14

## 2016-01-27 DIAGNOSIS — M797 Fibromyalgia: Secondary | ICD-10-CM | POA: Insufficient documentation

## 2016-01-27 DIAGNOSIS — M5416 Radiculopathy, lumbar region: Secondary | ICD-10-CM

## 2016-01-27 DIAGNOSIS — I10 Essential (primary) hypertension: Secondary | ICD-10-CM | POA: Insufficient documentation

## 2016-01-27 DIAGNOSIS — R51 Headache: Secondary | ICD-10-CM | POA: Diagnosis not present

## 2016-01-27 DIAGNOSIS — Z79899 Other long term (current) drug therapy: Secondary | ICD-10-CM | POA: Diagnosis not present

## 2016-01-27 DIAGNOSIS — M5137 Other intervertebral disc degeneration, lumbosacral region: Secondary | ICD-10-CM | POA: Diagnosis not present

## 2016-01-27 MED ORDER — PREGABALIN 150 MG PO CAPS: 150.0000 mg | ORAL_CAPSULE | Freq: Two times a day (BID) | ORAL | Status: DC

## 2016-01-27 MED ORDER — TRAMADOL HCL 50 MG PO TABS: 100.0000 mg | ORAL_TABLET | Freq: Two times a day (BID) | ORAL | Status: DC

## 2016-01-27 NOTE — Progress Notes (Signed)
Bilateral S1 transforaminal epidural steroid injection under fluoroscopic guidance  Indication: Lumbosacral radiculitis is not relieved by medication management or other conservative care and interfering with self-care and mobility.   Informed consent was obtained after describing risk and benefits of the procedure with the patient, this includes bleeding, bruising, infection, paralysis and medication side effects.  The patient wishes to proceed and has given written consent.  Patient was placed in prone position.  The lumbar area was marked and prepped with Betadine.  It was entered with a 25-gauge 1-1/2 inch needle and one mL of 1% lidocaine was injected into the skin and subcutaneous tissue.  Then a 22-gauge 3.5 in spinal needle was inserted into the Right S1 sacral  foramen under AP, lateral, and oblique view.  Then a solution containing one mL of 10 mg per mL dexamethasone and 2 mL of 1% lidocaine was injected.  The patient tolerated procedure well. Same procedure was performed left S1 sacral foramen Post procedure instructions were given.  Please see post procedure form. 

## 2016-01-27 NOTE — Progress Notes (Signed)
  PROCEDURE RECORD [facility]   Name: Emma Newman DOB:1961-01-07 MRN: [id number]  Date:01/27/2016  Physician: Claudette LawsAndrew Kirsteins, MD    Nurse/CMA: Purvis SheffieldKen Tage Feggins, CMA  Allergies:  Allergies  Allergen Reactions  . Sulfa Antibiotics Other (See Comments)    BURNING SENSATIONS REDNESS    Consent Signed: Yes.    Is patient diabetic? No.  CBG today? N/A  Pregnant: No. LMP: Patient's last menstrual period was 07/05/2012. (age 55-55)  Anticoagulants: no Anti-inflammatory: no Antibiotics: no  Procedure: bilatera S1 transforaminal epidural steroid injection  Position: Prone Start Time: 1:47pm  End Time:   Fluoro Time:   RN/CMA Teresa CoombsKen Yochanan Eddleman Ken Bohden Dung    Time 1:30pm 1:58pm    BP 140/67 139/69    Pulse 62 59    Respirations 14 14    O2 Sat 98 99    S/S 6 6    Pain Level 4/10 4/10     D/C home with father-in-law, patient A & O X 3, D/C instructions reviewed, and sits independently.

## 2016-01-27 NOTE — Patient Instructions (Signed)

## 2016-02-18 ENCOUNTER — Other Ambulatory Visit: Payer: Self-pay | Admitting: Physical Medicine & Rehabilitation

## 2016-04-27 ENCOUNTER — Encounter: Payer: Self-pay | Admitting: Physical Medicine & Rehabilitation

## 2016-04-27 ENCOUNTER — Ambulatory Visit (HOSPITAL_BASED_OUTPATIENT_CLINIC_OR_DEPARTMENT_OTHER): Payer: Worker's Compensation | Admitting: Physical Medicine & Rehabilitation

## 2016-04-27 ENCOUNTER — Encounter: Payer: Worker's Compensation | Attending: Physical Medicine & Rehabilitation

## 2016-04-27 VITALS — BP 129/86 | HR 66 | Resp 17

## 2016-04-27 DIAGNOSIS — M5137 Other intervertebral disc degeneration, lumbosacral region: Secondary | ICD-10-CM | POA: Insufficient documentation

## 2016-04-27 DIAGNOSIS — M5416 Radiculopathy, lumbar region: Secondary | ICD-10-CM | POA: Diagnosis present

## 2016-04-27 DIAGNOSIS — M545 Low back pain: Secondary | ICD-10-CM | POA: Insufficient documentation

## 2016-04-27 NOTE — Patient Instructions (Signed)

## 2016-04-27 NOTE — Progress Notes (Signed)
  PROCEDURE RECORD [facility]   Name: Emma Newman DOB:[birth year] MRN: [id number]  Date:04/27/2016  Physician: Claudette LawsAndrew Kirsteins, MD    Nurse/CMA: Kathrine HaddockAmber Jameshia Hayashida, CMA  Allergies:  Allergies  Allergen Reactions  . Sulfa Antibiotics Other (See Comments)    BURNING SENSATIONS REDNESS    Consent Signed: Yes.    Is patient diabetic? No.  CBG today? N/A  Pregnant: No. LMP: Patient's last menstrual period was 07/05/2012. (age [age])  Anticoagulants: no Anti-inflammatory: no Antibiotics: no  Procedure: Bilateral S1 injections Position: Prone Start Time: 1409 End Time: 1414 Fluoro Time: 19  RN/CMA Iveliz Garay, CMA Binyomin Brann,CMA     Time 1355 1418    BP 129/86 130/86    Pulse 65 64    Respirations 17 17    O2 Sat 95 95    S/S 6 6    Pain Level 4/10 4/10     D/C home with father-in-law, patient A & O X 3, D/C instructions reviewed, and sits independently.

## 2016-04-27 NOTE — Progress Notes (Signed)
Bilateral S1 transforaminal epidural steroid injection under fluoroscopic guidance  Indication: Lumbosacral radiculitis is not relieved by medication management or other conservative care and interfering with self-care and mobility.   Informed consent was obtained after describing risk and benefits of the procedure with the patient, this includes bleeding, bruising, infection, paralysis and medication side effects.  The patient wishes to proceed and has given written consent.  Patient was placed in prone position.  The lumbar area was marked and prepped with Betadine.  It was entered with a 25-gauge 1-1/2 inch needle and one mL of 1% lidocaine was injected into the skin and subcutaneous tissue.  Then a 22-gauge 3.5 in spinal needle was inserted into the Right S1 sacral  foramen under AP, lateral, and oblique view.  Then a solution containing one mL of 10 mg per mL dexamethasone and 2 mL of 1% lidocaine was injected.  The patient tolerated procedure well. Same procedure was performed left S1 sacral foramen Post procedure instructions were given.  Please see post procedure form.

## 2016-07-29 ENCOUNTER — Ambulatory Visit: Payer: Self-pay | Admitting: Physical Medicine & Rehabilitation

## 2016-08-03 ENCOUNTER — Ambulatory Visit: Payer: Self-pay | Admitting: Physical Medicine & Rehabilitation

## 2016-08-06 ENCOUNTER — Encounter: Payer: Worker's Compensation | Attending: Physical Medicine & Rehabilitation

## 2016-08-06 ENCOUNTER — Ambulatory Visit (INDEPENDENT_AMBULATORY_CARE_PROVIDER_SITE_OTHER): Payer: BLUE CROSS/BLUE SHIELD

## 2016-08-06 ENCOUNTER — Encounter: Payer: Self-pay | Admitting: Podiatry

## 2016-08-06 ENCOUNTER — Ambulatory Visit (INDEPENDENT_AMBULATORY_CARE_PROVIDER_SITE_OTHER): Payer: BLUE CROSS/BLUE SHIELD | Admitting: Podiatry

## 2016-08-06 ENCOUNTER — Ambulatory Visit (HOSPITAL_BASED_OUTPATIENT_CLINIC_OR_DEPARTMENT_OTHER): Payer: Worker's Compensation | Admitting: Physical Medicine & Rehabilitation

## 2016-08-06 ENCOUNTER — Encounter: Payer: Self-pay | Admitting: Physical Medicine & Rehabilitation

## 2016-08-06 VITALS — BP 139/89 | HR 63 | Resp 14

## 2016-08-06 DIAGNOSIS — M7661 Achilles tendinitis, right leg: Secondary | ICD-10-CM

## 2016-08-06 DIAGNOSIS — R52 Pain, unspecified: Secondary | ICD-10-CM

## 2016-08-06 DIAGNOSIS — M797 Fibromyalgia: Secondary | ICD-10-CM | POA: Insufficient documentation

## 2016-08-06 DIAGNOSIS — M7742 Metatarsalgia, left foot: Secondary | ICD-10-CM | POA: Diagnosis not present

## 2016-08-06 DIAGNOSIS — I10 Essential (primary) hypertension: Secondary | ICD-10-CM | POA: Insufficient documentation

## 2016-08-06 DIAGNOSIS — B351 Tinea unguium: Secondary | ICD-10-CM

## 2016-08-06 DIAGNOSIS — Z9889 Other specified postprocedural states: Secondary | ICD-10-CM | POA: Insufficient documentation

## 2016-08-06 DIAGNOSIS — M722 Plantar fascial fibromatosis: Secondary | ICD-10-CM | POA: Diagnosis not present

## 2016-08-06 DIAGNOSIS — M5416 Radiculopathy, lumbar region: Secondary | ICD-10-CM | POA: Diagnosis not present

## 2016-08-06 DIAGNOSIS — Z803 Family history of malignant neoplasm of breast: Secondary | ICD-10-CM | POA: Diagnosis not present

## 2016-08-06 DIAGNOSIS — M7741 Metatarsalgia, right foot: Secondary | ICD-10-CM

## 2016-08-06 DIAGNOSIS — Z8249 Family history of ischemic heart disease and other diseases of the circulatory system: Secondary | ICD-10-CM | POA: Insufficient documentation

## 2016-08-06 DIAGNOSIS — M5117 Intervertebral disc disorders with radiculopathy, lumbosacral region: Secondary | ICD-10-CM | POA: Diagnosis not present

## 2016-08-06 DIAGNOSIS — M5137 Other intervertebral disc degeneration, lumbosacral region: Secondary | ICD-10-CM | POA: Diagnosis not present

## 2016-08-06 MED ORDER — PREGABALIN 150 MG PO CAPS: 150.0000 mg | ORAL_CAPSULE | Freq: Two times a day (BID) | ORAL | 1 refills | Status: DC

## 2016-08-06 MED ORDER — TRAMADOL HCL 50 MG PO TABS: 100.0000 mg | ORAL_TABLET | Freq: Two times a day (BID) | ORAL | 5 refills | Status: DC

## 2016-08-06 MED ORDER — MELOXICAM 15 MG PO TABS: 15.0000 mg | ORAL_TABLET | Freq: Every day | ORAL | 2 refills | Status: DC

## 2016-08-06 NOTE — Patient Instructions (Signed)
Next appointment is in 6 months. However, if you feel like he would like another epidural injection prior to that time, please call us to schedule

## 2016-08-06 NOTE — Progress Notes (Signed)
Subjective:  Bilateral S1 Transforaminal epidural was performed on 04/27/2016. Patient had good results for a couple months.   Patient ID: Emma Newman, female    DOB: [birth year], 55 y.o.   MRN: [id number]  HPI Also being treated by a podiatrist for right Achilles tendinitis and left plantar fasciitis. Patient continues to work on a part-time basis She is tolerating her tramadol as well as Lyrica without any side effects. Started on mobility today by her podiatrist Pain Inventory Average Pain 4 Pain Right Now 3 My pain is sharp, dull and tingling  In the last 24 hours, has pain interfered with the following? General activity 3 Relation with others 2 Enjoyment of life 3 What TIME of day is your pain at its worst? evening, night Sleep (in general) Fair  Pain is worse with: sitting, inactivity, standing and some activites Pain improves with: rest, therapy/exercise, pacing activities and medication Relief from Meds: 7  Mobility walk without assistance how many minutes can you walk? 30+ ability to climb steps?  yes do you drive?  yes Do you have any goals in this area?  yes  Function not employed: date last employed 25 hrs/week  Neuro/Psych weakness numbness tingling spasms  Prior Studies Any changes since last visit?  no  Physicians involved in your care Any changes since last visit?  no   Family History  Problem Relation Age of Onset  . Aneurysm Mother [id number]    died from complications of AAA  . Cancer Mother     breast cancer  . Cancer Father [id number]    mesothelioma  . Heart disease Father     cardiomyopathy   Social History   Social History  . Marital status: Unknown    Spouse name: N/A  . Number of children: N/A  . Years of education: N/A   Social History Main Topics  . Smoking status: Never Smoker  . Smokeless tobacco: Never Used  . Alcohol use Yes     Comment: social  . Drug use: No  . Sexual activity: Not Asked   Other Topics Concern  . None    Social History Narrative  . None   Past Surgical History:  Procedure Laterality Date  . APPENDECTOMY     1981  . DIGIT NAIL REMOVAL Right 06/19/2015   Procedure: Removal right hallux nail plate;  Surgeon: Toni ArthursJohn Hewitt, MD;  Location: [facility];  Service: Orthopedics;  Laterality: Right;  . ORIF TOE FRACTURE Right 06/19/2015   Procedure: Repair extensor  hallucus longus tendon to distal phalanx;  Surgeon: Toni ArthursJohn Hewitt, MD;  Location: [facility];  Service: Orthopedics;  Laterality: Right;  . TONSILLECTOMY     Past Medical History:  Diagnosis Date  . Fibromyalgia   . Headache   . Herniated lumbar intervertebral disc    L4-S1  . Hypertension    BP 139/89   Pulse 63   Resp 14   LMP 07/05/2012   SpO2 95%   Opioid Risk Score:   Fall Risk Score:  `1  Depression screen PHQ 2/9  Depression screen PHQ 2/9 01/21/2015  Decreased Interest 2  Down, Depressed, Hopeless 1  PHQ - 2 Score 3  Altered sleeping 1  Tired, decreased energy 1  Change in appetite 1  Feeling bad or failure about yourself  0  Trouble concentrating 1  Moving slowly or fidgety/restless 0  Suicidal thoughts 0  PHQ-9 Score 7    Review of Systems  Constitutional: Positive  for unexpected weight change.       Night sweats  All other systems reviewed and are negative.      Objective:   Physical Exam  Constitutional: She is oriented to person, place, and time. She appears well-developed and well-nourished.  HENT:  Head: Normocephalic and atraumatic.  Eyes: Conjunctivae and EOM are normal. Pupils are equal, round, and reactive to light.  Neck: Normal range of motion.  Neurological: She is alert and oriented to person, place, and time. Gait abnormal.  Reflex Scores:      Patellar reflexes are 1+ on the right side and 1+ on the left side.      Achilles reflexes are 1+ on the right side and 1+ on the left side. Reduced sensation. Left S1 Equivocal reduction right foot  Motor  strength is 5/5 in right lateral hip flexors, knee extensor, ankle dorsiflexor, plantar flexor Straight leg raising is negative.  Skin: Skin is warm and dry.  Psychiatric: She has a normal mood and affect.  Nursing note and vitals reviewed.  Lumbar spine approximately 25% flexion, extension, lateral bending and rotation, no tenderness to palpation along the cervical, thoracic or lumbar paraspinal musculatures. No evidence of spinal deformity.        Assessment & Plan:  1. 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 Sensory symptoms a little more diffuse on the right and discrete on the left side.  She's had decent two-month relief with epidural injections. However, she is not sure she wants to continue these on an every 3 month basis. She will call back to schedule additional S1 transforaminal injections. Otherwise, we'll see her back in 6 months for medication recheck  Continue Lyrica 150 mg twice a day Continue tramadol 100 mg twice a day

## 2016-08-08 NOTE — Progress Notes (Addendum)
Subjective: 55 year old female presents the office today for concerns of pain along her right Achilles tendon in the left plantar fascia which started at the last couple months. After last appointment with me she had significant report improvement in symptoms over the last several months she is reoccurrence. She doesn't the areas are tied reviewed painful when she walks and stands for long period time. Emma Newman gets pain in the ball of her feet on both sides. She denies any recent injury or trauma denies any swelling or redness. No numbness or tingling. She said no recent treatment. She believes that after she broke her toe on the right foot she was in a CAM boot for some time which did help her symptoms however since discontinuing that she started having recurrence. She said her right toenail has been cracking and somewhat discolored. Denies any systemic complaints such as fevers, chills, nausea, vomiting. No acute changes since last appointment, and no other complaints at this time.   Objective: AAO x3, NAD DP/PT pulses palpable bilaterally, CRT less than 3 seconds Protective sensation intact with Simms Weinstein monofilament There is tenderness palpation on the plantar medial tubercle of the calcaneus at the insertion the plantar fashion the left side. There is mild discomfort along the medial band of the plantar fascial in the arch of the foot. There is no pain with lateral compression of the calcaneus. On the right side there is tenderness along the distal portion the Achilles tendon on the insertion of the calcaneus. There does appear to be a prominence in the posterior aspect of the heel consistent with retrocalcaneal exostosis. There is no overlying edema, erythema, increase in warmth. Thompson test is negative. No other areas of tenderness present on the right foot other than she is having diffuse submetatarsal pain 1-5 bilaterally. There is atrophy of the fat pad and prominence of the metatarsal  heads. No areas of pinpoint bony tenderness or pain with vibratory sensation. MMT 5/5, ROM WNL. No edema, erythema, increase in warmth to bilateral lower extremities.  Right hallux on somewhat discolored, dystrophic and they're splitting of the toenail. No open lesions or pre-ulcerative lesions.  No pain with calf compression, swelling, warmth, erythema  Assessment: Left plantar fasciitis, right Achilles tendinitis, bilateral metatarsalgia  Plan: -All treatment options discussed with the patient including all alternatives, risks, complications.  -X-rays were obtained and reviewed with the patient. No evidence of acute fracture small calcaneal spurring is present. -Patient elects to proceed with steroid injection into the left plantar heel. Under sterile skin preparation, a total of 2.5cc of kenalog 10, 0.5% Marcaine plain, and 2% lidocaine plain were infiltrated into the symptomatic area without complication. A band-aid was applied. Patient tolerated the injection well without complication. Post-injection care with discussed with the patient. Discussed with the patient to ice the area over the next couple of days to help prevent a steroid flare.  -Continue with night splint bilaterally. She already has this at home -Stretching, icing exercises -Prescribed mobic. Discussed side effects of the medication and directed to stop if any are to occur and call the office.  -She has orthotics at home but she is not been wearing them. Discussed with her to slowly restart this. -Metatarsal pads dispensed.  -Plantar fascial brace on the left dispensed.  -Compound cream for onychomycosis with her. -Follow-up in 3-4 weeks or sooner if needed. -Patient encouraged to call the office with any questions, concerns, change in symptoms.   Emma Newman, DPM

## 2016-08-09 ENCOUNTER — Telehealth: Payer: Self-pay | Admitting: *Deleted

## 2016-08-09 MED ORDER — NONFORMULARY OR COMPOUNDED ITEM: 2 refills | Status: DC

## 2016-08-09 NOTE — Telephone Encounter (Signed)
Dr. Ardelle AntonWAgoner ordered [employer] Onych[employer] nail lacquer. [facility] to [facility].

## 2016-08-27 ENCOUNTER — Ambulatory Visit: Payer: BLUE CROSS/BLUE SHIELD | Admitting: Podiatry

## 2016-09-10 ENCOUNTER — Ambulatory Visit: Payer: BLUE CROSS/BLUE SHIELD | Admitting: Podiatry

## 2016-09-17 ENCOUNTER — Encounter: Payer: Self-pay | Admitting: Podiatry

## 2016-09-17 ENCOUNTER — Ambulatory Visit (INDEPENDENT_AMBULATORY_CARE_PROVIDER_SITE_OTHER): Payer: BLUE CROSS/BLUE SHIELD | Admitting: Podiatry

## 2016-09-17 VITALS — BP 149/88 | HR 61 | Resp 14

## 2016-09-17 DIAGNOSIS — M7661 Achilles tendinitis, right leg: Secondary | ICD-10-CM | POA: Diagnosis not present

## 2016-09-17 DIAGNOSIS — M7742 Metatarsalgia, left foot: Secondary | ICD-10-CM | POA: Diagnosis not present

## 2016-09-17 DIAGNOSIS — M7741 Metatarsalgia, right foot: Secondary | ICD-10-CM

## 2016-09-17 DIAGNOSIS — M722 Plantar fascial fibromatosis: Secondary | ICD-10-CM | POA: Diagnosis not present

## 2016-09-17 NOTE — Patient Instructions (Signed)
Plantar Fasciitis Rehab Ask your health care provider which exercises are safe for you. Do exercises exactly as told by your health care provider and adjust them as directed. It is normal to feel mild stretching, pulling, tightness, or discomfort as you do these exercises, but you should stop right away if you feel sudden pain or your pain gets worse. Do not begin these exercises until told by your health care provider. Stretching and range of motion exercises These exercises warm up your muscles and joints and improve the movement and flexibility of your foot. These exercises also help to relieve pain. Exercise A: Plantar fascia stretch 1. Sit with your left / right leg crossed over your opposite knee. 2. Hold your heel with one hand with that thumb near your arch. With your other hand, hold your toes and gently pull them back toward the top of your foot. You should feel a stretch on the bottom of your toes or your foot or both. 3. Hold this stretch for__________ seconds. 4. Slowly release your toes and return to the starting position. Repeat __________ times. Complete this exercise __________ times a day. Exercise B: Gastroc, standing 1. Stand with your hands against a wall. 2. Extend your left / right leg behind you, and bend your front knee slightly. 3. Keeping your heels on the floor and keeping your back knee straight, shift your weight toward the wall without arching your back. You should feel a gentle stretch in your left / right calf. 4. Hold this position for __________ seconds. Repeat __________ times. Complete this exercise __________ times a day. Exercise C: Soleus, standing 1. Stand with your hands against a wall. 2. Extend your left / right leg behind you, and bend your front knee slightly. 3. Keeping your heels on the floor, bend your back knee and slightly shift your weight over the back leg. You should feel a gentle stretch deep in your calf. 4. Hold this position for __________  seconds. Repeat __________ times. Complete this exercise __________ times a day. Exercise D: Gastrocsoleus, standing 1. Stand with the ball of your left / right foot on a step. The ball of your foot is on the walking surface, right under your toes. 2. Keep your other foot firmly on the same step. 3. Hold onto the wall or a railing for balance. 4. Slowly lift your other foot, allowing your body weight to press your heel down over the edge of the step. You should feel a stretch in your left / right calf. 5. Hold this position for __________ seconds. 6. Return both feet to the step. 7. Repeat this exercise with a slight bend in your left / right knee. Repeat __________ times with your left / right knee straight and __________ times with your left / right knee bent. Complete this exercise __________ times a day. Balance exercise This exercise builds your balance and strength control of your arch to help take pressure off your plantar fascia. Exercise E: Single leg stand 1. Without shoes, stand near a railing or in a doorway. You may hold onto the railing or door frame as needed. 2. Stand on your left / right foot. Keep your big toe down on the floor and try to keep your arch lifted. Do not let your foot roll inward. 3. Hold this position for __________ seconds. 4. If this exercise is too easy, you can try it with your eyes closed or while standing on a pillow. Repeat __________ times. Complete this exercise   __________ times a day. This information is not intended to replace advice given to you by your health care provider. Make sure you discuss any questions you have with your health care provider. Document Released: 09/27/2005 Document Revised: 06/01/2016 Document Reviewed: 08/11/2015 Elsevier Interactive Patient Education  2017 [employer]   Achilles Tendinitis Rehab Ask your health care provider which exercises are safe for you. Do exercises exactly as told by your health care provider and  adjust them as directed. It is normal to feel mild stretching, pulling, tightness, or discomfort as you do these exercises, but you should stop right away if you feel sudden pain or your pain gets worse. Do not begin these exercises until told by your health care provider. Stretching and range of motion exercises These exercises warm up your muscles and joints and improve the movement and flexibility of your ankle. These exercises also help to relieve pain, numbness, and tingling. Exercise A: Standing wall calf stretch, knee straight 5. Stand with your hands against a wall. 6. Extend your __________ leg behind you and bend your front knee slightly. Keep both of your heels on the floor. 7. Point the toes of your back foot slightly inward. 8. Keeping your heels on the floor and your back knee straight, shift your weight toward the wall. Do not allow your back to arch. You should feel a gentle stretch in your calf. 9. Hold this position for seconds. Repeat __________ times. Complete this stretch __________ times per day. Exercise B: Standing wall calf stretch, knee bent 5. Stand with your hands against a wall. 6. Extend your __________ leg behind you, and bend your front knee slightly. Keep both of your heels on the floor. 7. Point the toes of your back foot slightly inward. 8. Keeping your heels on the floor, unlock your back knee so that it is bent. You should feel a gentle stretch deep in your calf. 9. Hold this position for __________ seconds. Repeat __________ times. Complete this stretch __________ times per day. Strengthening exercises These exercises build strength and control of your ankle. Endurance is the ability to use your muscles for a long time, even after they get tired. Exercise C: Plantar flexion with band 5. Sit on the floor with your __________ leg extended. You may put a pillow under your calf to give your foot more room to move. 6. Loop a rubber exercise band or tube around  the ball of your __________ foot. The ball of your foot is on the walking surface, right under your toes. The band or tube should be slightly tense when your foot is relaxed. If the band or tube slips, you can put on your shoe or put a washcloth between the band and your foot to help it stay in place. 7. Slowly point your toes downward, pushing them away from you. 8. Hold this position for __________ seconds. 9. Slowly release the tension in the band or tube, controlling smoothly until your foot is back to the starting position. Repeat __________ times. Complete this exercise __________ times per day. Exercise D: Heel raise with eccentric lower 1. Stand on a step with the balls of your feet. The ball of your foot is on the walking surface, right under your toes.  Do not put your heels on the step.  For balance, rest your hands on the wall or on a railing. 2. Rise up onto the balls of your feet. 3. Keeping your heels up, shift all of your weight to   your __________ leg and pick up your other leg. 4. Slowly lower your __________ leg so your heel drops below the level of the step. 5. Put down your foot. If told by your health care provider, build up to:  3 sets of 15 repetitions while keeping your knees straight.  3 sets of 15 repetitions while keeping your knees bent as far as told by your health care provider. Complete this exercise __________ times per day. If this exercise is too easy, try doing it while wearing a backpack with weights in it. Balance exercises These exercises improve or maintain your balance. Balance is important in preventing falls. Exercise E: Single leg stand 1. Without shoes, stand near a railing or in a door frame. Hold on to the railing or door frame as needed. 2. Stand on your __________ foot. Keep your big toe down on the floor and try to keep your arch lifted. 3. Hold this position for __________ seconds. Repeat __________ times. Complete this exercise __________  times per day. If this exercise is too easy, you can try it with your eyes closed or while standing on a pillow. This information is not intended to replace advice given to you by your health care provider. Make sure you discuss any questions you have with your health care provider. Document Released: 04/28/2005 Document Revised: 06/03/2016 Document Reviewed: 06/03/2015 Elsevier Interactive Patient Education  2017 [employer]   

## 2016-09-20 NOTE — Progress Notes (Signed)
Subjective: 55 year old female presents the office today for follow-up evaluation of left heel pain" is tendinitis. She says that overall she is doing better. She's been stretching, icing. She's been wearing an over-the-counter insert which is been helping with a metatarsal pads which is asking me is to replace symptoms today for her. Her pain is becoming more intermittent. The pain is worse as she is on her feet all day at work. She's been working overtime to the holidays which is been aggravating her feet. Denies any systemic complaints such as fevers, chills, nausea, vomiting. No acute changes since last appointment, and no other complaints at this time.   Objective: AAO x3, NAD DP/PT pulses palpable bilaterally, CRT less than 3 seconds Protective sensation intact with Simms Weinstein monofilament Tenderness to palpation along the plantar medial tubercle of the calcaneus at the insertion of plantar fascia on the left foot. There is no pain along the course of the plantar fascia within the arch of the foot. Plantar fascia appears to be intact. There is no pain with lateral compression of the calcaneus or pain with vibratory sensation. There is no pain along the course or insertion of the achilles tendon.  There is minimal tenderness palpation of the course last insertion of the Achilles tendon of right foot. Equinus does continue with a right side worse than left. There is no overlying edema, erythema, increase in warmth is No pain lateral compression of the calcaneus. Submetatarsal pain appears to be improving with the offloading pads. No open lesions or pre-ulcerative lesions.  No pain with calf compression, swelling, warmth, erythema  Assessment: Left plantar fasciitis, right Achilles tendinitis  Plan: -All treatment options discussed with the patient including all alternatives, risks, complications.  -Patient elects to proceed with steroid injection into the left heel. Under sterile skin  preparation, a total of 2.5cc of kenalog 10, 0.5% Marcaine plain, and 2% lidocaine plain were infiltrated into the symptomatic area without complication. A band-aid was applied. Patient tolerated the injection well without complication. Post-injection care with discussed with the patient. Discussed with the patient to ice the area over the next couple of days to help prevent a steroid flare.  -Continue stretching, icing daily as well. Discussed the possible physical therapy which she would like to hold off until after the holidays for this due to work. -Replace metatarsal pads in her over-the-counter insert. Discussed shoe gear modifications as well. -Continue night splint as well. -Using compound cream for nail fungus.  -Follow-up in 4 weeks or sooner if needed. -Patient encouraged to call the office with any questions, concerns, change in symptoms.   Ovid CurdMatthew Mikyah Alamo, DPM

## 2016-10-18 ENCOUNTER — Ambulatory Visit: Payer: BLUE CROSS/BLUE SHIELD | Admitting: Podiatry

## 2016-11-15 ENCOUNTER — Other Ambulatory Visit: Payer: Self-pay | Admitting: Podiatry

## 2017-01-10 ENCOUNTER — Ambulatory Visit: Payer: Self-pay | Admitting: Physical Medicine & Rehabilitation

## 2017-01-14 ENCOUNTER — Encounter: Payer: Self-pay | Admitting: Physical Medicine & Rehabilitation

## 2017-01-14 ENCOUNTER — Ambulatory Visit (HOSPITAL_BASED_OUTPATIENT_CLINIC_OR_DEPARTMENT_OTHER): Payer: Worker's Compensation | Admitting: Physical Medicine & Rehabilitation

## 2017-01-14 ENCOUNTER — Encounter: Payer: Worker's Compensation | Attending: Physical Medicine & Rehabilitation

## 2017-01-14 VITALS — BP 121/81 | HR 66

## 2017-01-14 DIAGNOSIS — I10 Essential (primary) hypertension: Secondary | ICD-10-CM | POA: Insufficient documentation

## 2017-01-14 DIAGNOSIS — M797 Fibromyalgia: Secondary | ICD-10-CM | POA: Diagnosis not present

## 2017-01-14 DIAGNOSIS — M5136 Other intervertebral disc degeneration, lumbar region: Secondary | ICD-10-CM | POA: Diagnosis not present

## 2017-01-14 DIAGNOSIS — M5116 Intervertebral disc disorders with radiculopathy, lumbar region: Secondary | ICD-10-CM | POA: Diagnosis present

## 2017-01-14 MED ORDER — PREGABALIN 150 MG PO CAPS: 150.0000 mg | ORAL_CAPSULE | Freq: Two times a day (BID) | ORAL | 1 refills | Status: DC

## 2017-01-14 MED ORDER — TRAMADOL HCL 50 MG PO TABS: 100.0000 mg | ORAL_TABLET | Freq: Two times a day (BID) | ORAL | 5 refills | Status: DC

## 2017-01-14 NOTE — Progress Notes (Signed)
Subjective:    Patient ID: Emma Newman, female    DOB: 31-May-1961, 56 y.o.   MRN: [id number]  HPI   Pain Inventory Average Pain 2 Pain Right Now 1 My pain is dull  In the last 24 hours, has pain interfered with the following? General activity 2 Relation with others 3 Enjoyment of life 3 What TIME of day is your pain at its worst? night Sleep (in general) Fair  Pain is worse with: bending, sitting, inactivity and some activites Pain improves with: rest, therapy/exercise, pacing activities and medication Relief from Meds: 7  Mobility walk without assistance ability to climb steps?  yes do you drive?  yes  Function employed # of hrs/week 25  Neuro/Psych weakness numbness tingling spasms  Prior Studies Any changes since last visit?  no  Physicians involved in your care Any changes since last visit?  no   Family History  Problem Relation Age of Onset  . Aneurysm Mother 68    died from complications of AAA  . Cancer Mother     breast cancer  . Cancer Father 49    mesothelioma  . Heart disease Father     cardiomyopathy   Social History   Social History  . Marital status: Unknown    Spouse name: N/A  . Number of children: N/A  . Years of education: N/A   Social History Main Topics  . Smoking status: Never Smoker  . Smokeless tobacco: Never Used  . Alcohol use Yes     Comment: social  . Drug use: No  . Sexual activity: Not on file   Other Topics Concern  . Not on file   Social History Narrative  . No narrative on file   Past Surgical History:  Procedure Laterality Date  . APPENDECTOMY     1981  . DIGIT NAIL REMOVAL Right 06/19/2015   Procedure: Removal right hallux nail plate;  Surgeon: Toni Arthurs, MD;  Location: [facility];  Service: Orthopedics;  Laterality: Right;  . ORIF TOE FRACTURE Right 06/19/2015   Procedure: Repair extensor  hallucus longus tendon to distal phalanx;  Surgeon: Toni Arthurs, MD;  Location: [facility];  Service: Orthopedics;  Laterality: Right;  . TONSILLECTOMY     Past Medical History:  Diagnosis Date  . Fibromyalgia   . Headache   . Herniated lumbar intervertebral disc    L4-S1  . Hypertension    LMP 07/05/2012   Opioid Risk Score:   Fall Risk Score:  `1  Depression screen PHQ 2/9  Depression screen PHQ 2/9 01/21/2015  Decreased Interest 2  Down, Depressed, Hopeless 1  PHQ - 2 Score 3  Altered sleeping 1  Tired, decreased energy 1  Change in appetite 1  Feeling bad or failure about yourself  0  Trouble concentrating 1  Moving slowly or fidgety/restless 0  Suicidal thoughts 0  PHQ-9 Score 7    Review of Systems  Constitutional: Positive for fever.  HENT: Negative.   Eyes: Negative.   Respiratory: Negative.   Cardiovascular: Negative.   Gastrointestinal: Negative.   Endocrine: Negative.   Genitourinary: Negative.   Musculoskeletal: Negative.   Skin: Negative.   Allergic/Immunologic: Negative.   Neurological: Negative.   Hematological: Negative.   Psychiatric/Behavioral: Negative.   All other systems reviewed and are negative.      Objective:   Physical Exam  Constitutional: She is oriented to person, place, and time.  Neurological: She is alert and oriented  to person, place, and time.  Reflex Scores:      Patellar reflexes are 2+ on the right side and 2+ on the left side.      Achilles reflexes are 2+ on the right side and 2+ on the left side.   Decreased lumbar extension and lateral bending  Decreased sensation to pinprick, right L4, L5, S1 distribution, decreased sensation, pinprick left S1 distribution  Motor strength is 5/5 bilateral hip flexor, knee extensor, ankle dorsiflexor, plantar flexor is mildly diminished right lower limb compared to left     Assessment & Plan:  1.  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 She failed 1 of the preliminary tests looking up one trailer to another. She has lost some range of motion because she has not kept up with her home exercise program and may have gained some weight as well. Will make referral to physical therapy, focus on core strengthening, range of motion, upper and lower limb strengthening, instruction in community exercise program. Continue current medications tramadol 100 mg twice a day Lyrica 150 mg twice a day  No need for epidural injections at the current time

## 2017-01-14 NOTE — Patient Instructions (Signed)

## 2017-02-01 ENCOUNTER — Ambulatory Visit: Payer: Self-pay | Admitting: Physical Medicine & Rehabilitation

## 2017-05-17 ENCOUNTER — Ambulatory Visit: Payer: Self-pay

## 2017-05-17 ENCOUNTER — Other Ambulatory Visit: Payer: Self-pay | Admitting: Occupational Medicine

## 2017-05-17 DIAGNOSIS — M25562 Pain in left knee: Secondary | ICD-10-CM

## 2017-05-26 ENCOUNTER — Ambulatory Visit: Payer: BLUE CROSS/BLUE SHIELD | Admitting: Podiatry

## 2017-07-15 ENCOUNTER — Ambulatory Visit (HOSPITAL_BASED_OUTPATIENT_CLINIC_OR_DEPARTMENT_OTHER): Payer: Worker's Compensation | Admitting: Physical Medicine & Rehabilitation

## 2017-07-15 ENCOUNTER — Encounter: Payer: Self-pay | Admitting: Physical Medicine & Rehabilitation

## 2017-07-15 ENCOUNTER — Encounter: Payer: Worker's Compensation | Attending: Physical Medicine & Rehabilitation

## 2017-07-15 VITALS — BP 131/84 | HR 89

## 2017-07-15 DIAGNOSIS — W19XXXD Unspecified fall, subsequent encounter: Secondary | ICD-10-CM | POA: Insufficient documentation

## 2017-07-15 DIAGNOSIS — S82002D Unspecified fracture of left patella, subsequent encounter for closed fracture with routine healing: Secondary | ICD-10-CM | POA: Insufficient documentation

## 2017-07-15 DIAGNOSIS — M5417 Radiculopathy, lumbosacral region: Secondary | ICD-10-CM | POA: Diagnosis not present

## 2017-07-15 DIAGNOSIS — M5136 Other intervertebral disc degeneration, lumbar region: Secondary | ICD-10-CM | POA: Diagnosis not present

## 2017-07-15 DIAGNOSIS — Z8249 Family history of ischemic heart disease and other diseases of the circulatory system: Secondary | ICD-10-CM | POA: Insufficient documentation

## 2017-07-15 DIAGNOSIS — M549 Dorsalgia, unspecified: Secondary | ICD-10-CM | POA: Diagnosis present

## 2017-07-15 DIAGNOSIS — I10 Essential (primary) hypertension: Secondary | ICD-10-CM | POA: Diagnosis not present

## 2017-07-15 DIAGNOSIS — M797 Fibromyalgia: Secondary | ICD-10-CM | POA: Insufficient documentation

## 2017-07-15 DIAGNOSIS — M5416 Radiculopathy, lumbar region: Secondary | ICD-10-CM

## 2017-07-15 DIAGNOSIS — Z803 Family history of malignant neoplasm of breast: Secondary | ICD-10-CM | POA: Diagnosis not present

## 2017-07-15 MED ORDER — TRAMADOL HCL 50 MG PO TABS: 100.0000 mg | ORAL_TABLET | Freq: Two times a day (BID) | ORAL | 5 refills | Status: DC

## 2017-07-15 MED ORDER — PREGABALIN 150 MG PO CAPS: 150.0000 mg | ORAL_CAPSULE | Freq: Two times a day (BID) | ORAL | 1 refills | Status: DC

## 2017-07-15 NOTE — Progress Notes (Signed)
Subjective:    Patient ID: Emma Newman, female    DOB: 12/30/1960, 56 y.o.   MRN: [id number]  HPI   fell and fractured patella 05/10/2017 ,Patient presented to her physician. One week later and to an orthopedist. Another week later. She was treated conservatively using a knee immobilizer, followed by a Bledsoe brace and is now in a hinged left knee orthosis. She states she did not receive additional narcotic analgesics after her fracture. She  needs refills on lyrica and tramadol  Still in therapy and out of work for patellar fracture  No new back or nerve pain issues, RIght foot feels more numb He should've ran out of Lyrica and primary care prescribed gabapentin on a short-term basis. Patient states that this helped some with the nerve pain, but not as well as the Lyrica.  No hx of DM  Pain Inventory Average Pain 3 Pain Right Now 3 My pain is dull, stabbing and tingling  In the last 24 hours, has pain interfered with the following? General activity 4 Relation with others 2 Enjoyment of life 4 What TIME of day is your pain at its worst? evening and night Sleep (in general) Fair  Pain is worse with: inactivity, standing and some activites Pain improves with: rest, heat/ice, therapy/exercise, pacing activities and medication Relief from Meds: 7  Mobility walk without assistance ability to climb steps?  yes do you drive?  yes  Function disabled: date disabled Temporary disability since 05/10/2017  Neuro/Psych numbness  Prior Studies Any changes since last visit?  yes x-rays  Physicians involved in your care Any changes since last visit?  no   Family History  Problem Relation Age of Onset  . Aneurysm Mother 71       died from complications of AAA  . Cancer Mother        breast cancer  . Cancer Father 73       mesothelioma  . Heart disease Father        cardiomyopathy   Social History   Social History  . Marital status: Unknown    Spouse name: N/A  .  Number of children: N/A  . Years of education: N/A   Social History Main Topics  . Smoking status: Never Smoker  . Smokeless tobacco: Never Used  . Alcohol use Yes     Comment: social  . Drug use: No  . Sexual activity: Not Asked   Other Topics Concern  . None   Social History Narrative  . None   Past Surgical History:  Procedure Laterality Date  . APPENDECTOMY     1981  . DIGIT NAIL REMOVAL Right 06/19/2015   Procedure: Removal right hallux nail plate;  Surgeon: Toni Arthurs, MD;  Location: [facility];  Service: Orthopedics;  Laterality: Right;  . ORIF TOE FRACTURE Right 06/19/2015   Procedure: Repair extensor  hallucus longus tendon to distal phalanx;  Surgeon: Toni Arthurs, MD;  Location: [facility];  Service: Orthopedics;  Laterality: Right;  . TONSILLECTOMY     Past Medical History:  Diagnosis Date  . Fibromyalgia   . Headache   . Herniated lumbar intervertebral disc    L4-S1  . Hypertension    BP 131/84   Pulse 89   LMP 07/05/2012   SpO2 96%   Opioid Risk Score:   Fall Risk Score:  `1  Depression screen PHQ 2/9  Depression screen [facility] 2/9 07/15/2017 01/21/2015  Decreased Interest 0 2  Down, Depressed, Hopeless 0 1  PHQ - 2 Score 0 3  Altered sleeping - 1  Tired, decreased energy - 1  Change in appetite - 1  Feeling bad or failure about yourself  - 0  Trouble concentrating - 1  Moving slowly or fidgety/restless - 0  Suicidal thoughts - 0  PHQ-9 Score - 7    Review of Systems  Constitutional: Positive for diaphoresis.  HENT: Negative.   Eyes: Negative.   Respiratory: Negative.   Cardiovascular: Positive for leg swelling.  Gastrointestinal: Negative.   Endocrine: Negative.   Genitourinary: Negative.   Musculoskeletal: Positive for gait problem.  Skin: Negative.   Allergic/Immunologic: Negative.   Neurological: Positive for numbness.  Hematological: Negative.   Psychiatric/Behavioral: Negative.   All other systems  reviewed and are negative.      Objective:   Physical Exam  Constitutional: She is oriented to person, place, and time. She appears well-developed and well-nourished.  HENT:  Head: Normocephalic and atraumatic.  Eyes: Pupils are equal, round, and reactive to light. Conjunctivae and EOM are normal.  Neck: Normal range of motion. Neck supple.  Musculoskeletal:       Left knee: She exhibits decreased range of motion.  Wearing left knee orthosis  Neurological: She is alert and oriented to person, place, and time. She has normal strength. A sensory deficit is present. Gait abnormal.  Reflex Scores:      Patellar reflexes are 2+ on the right side.      Achilles reflexes are 1+ on the right side and 1+ on the left side. Reduced pinprick and light touch sensation right L5 and right S1 dermatomal distribution  Antalgic gait favoring the left lower extremity  Left patellar not tested secondary to knee orthosis  Strength is 5/5 in bilateral hip flexors, knee extensors and ankle dorsiflexors as well as great toe extensors and plantar flexors  Psychiatric: She has a normal mood and affect.  Nursing note and vitals reviewed.         Assessment & Plan:  1. Chronic right. Left S1 radiculopathy and lumbar degenerative disc, history of Worker's Comp. Injury She is getting good relief on her current pain medication regimen. Tramadol 100 mg twice a day Switch back from gabapentin to Lyrica 150 mg twice a day  Follow-up in 6 months

## 2017-09-14 IMAGING — DX DG KNEE COMPLETE 4+V*L*
5 series · 5 of 5 positions shown · non-contrast
Comparison: None.

CLINICAL DATA: Left knee pain after fall last week.

EXAM:
LEFT KNEE - COMPLETE 4+ VIEW

[knee ap]
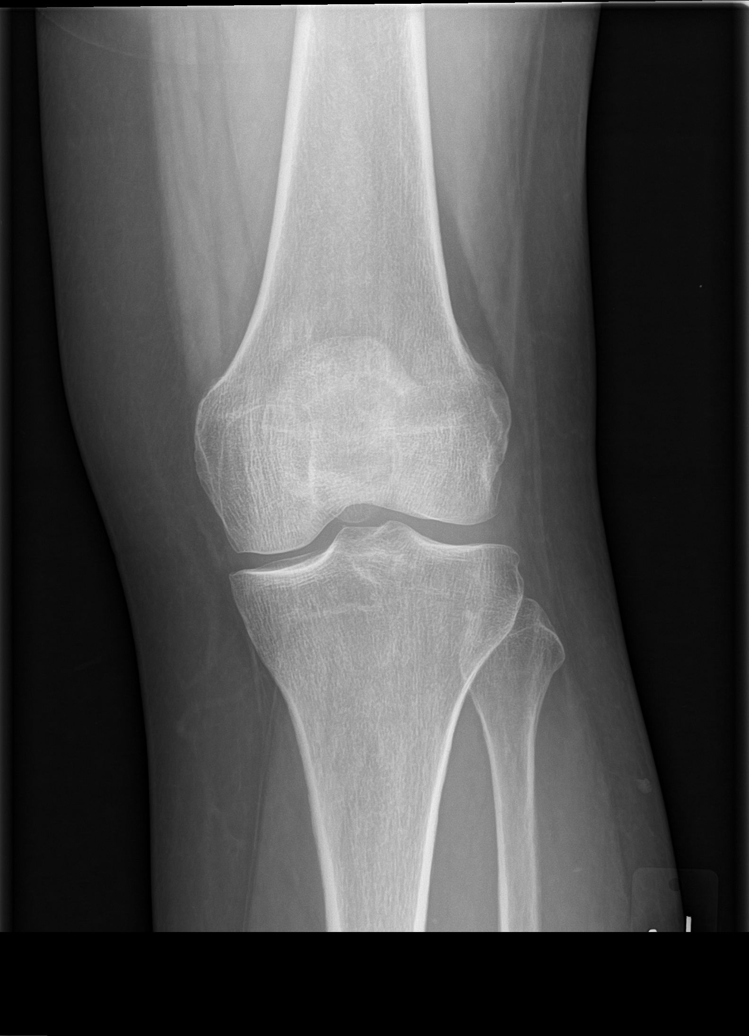

[knee obl (1 of 2)]
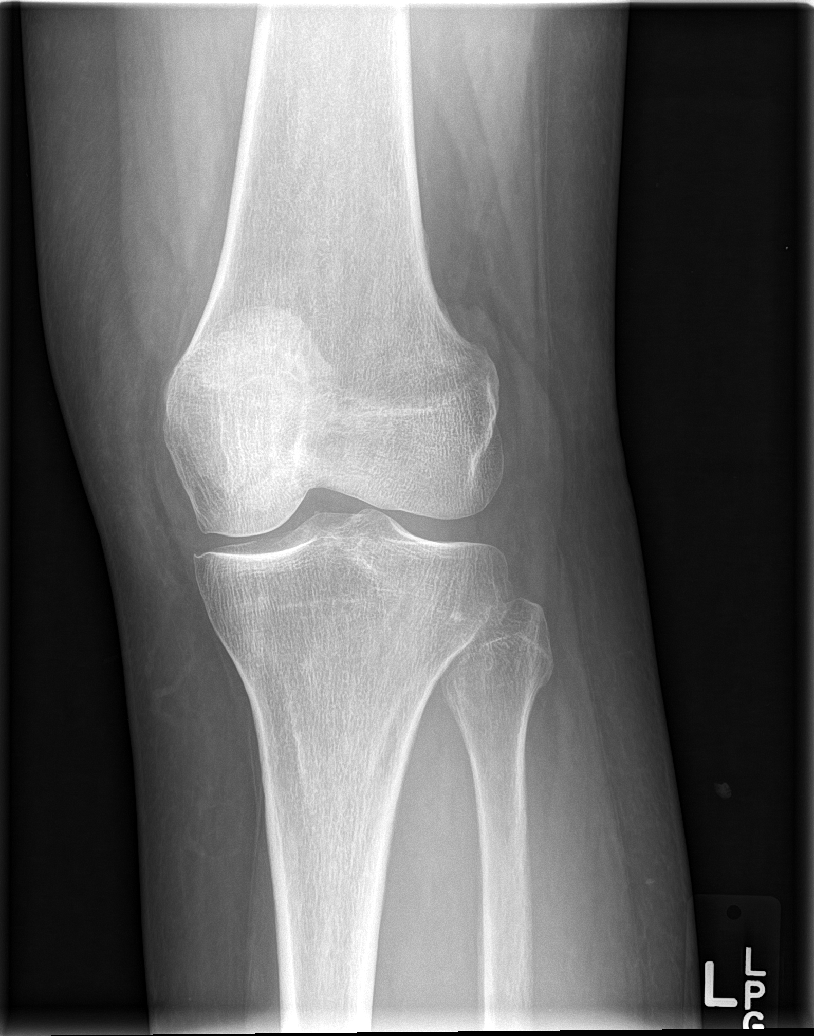

[knee obl (2 of 2)]
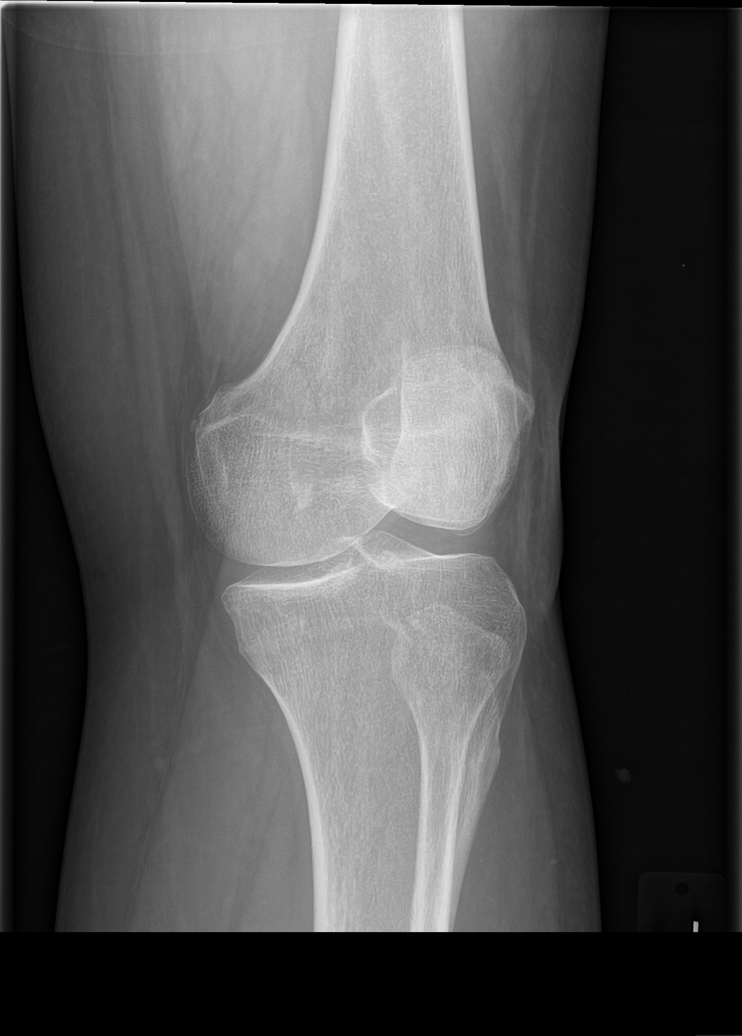

[knee lat]
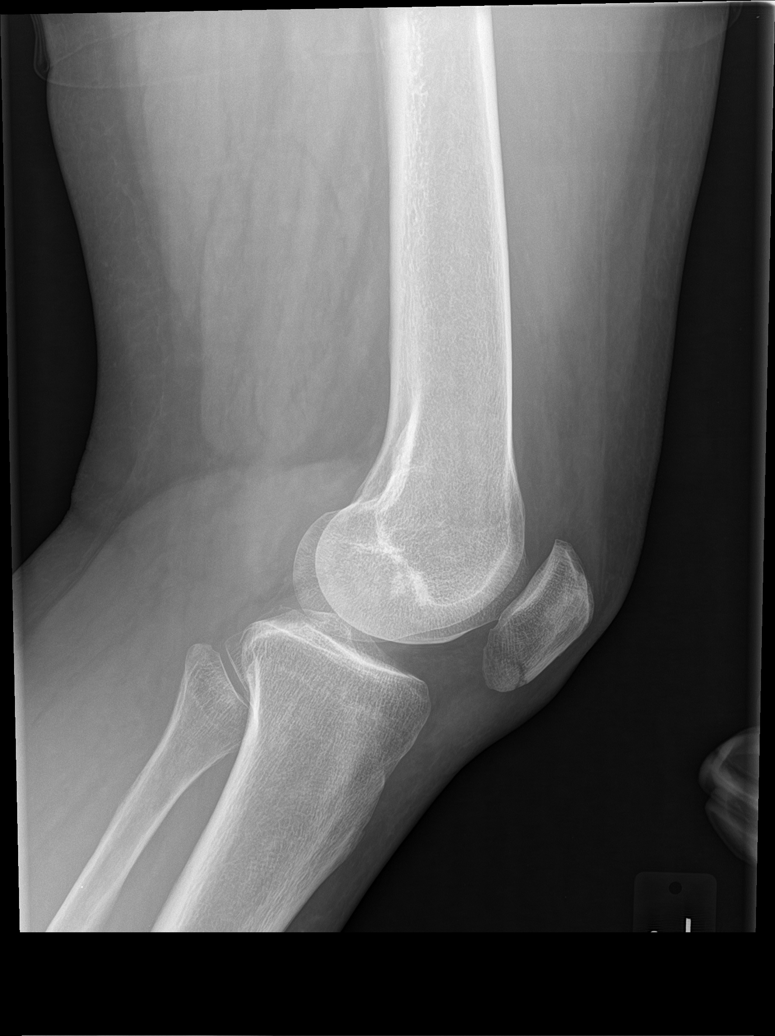

[patella skyline]
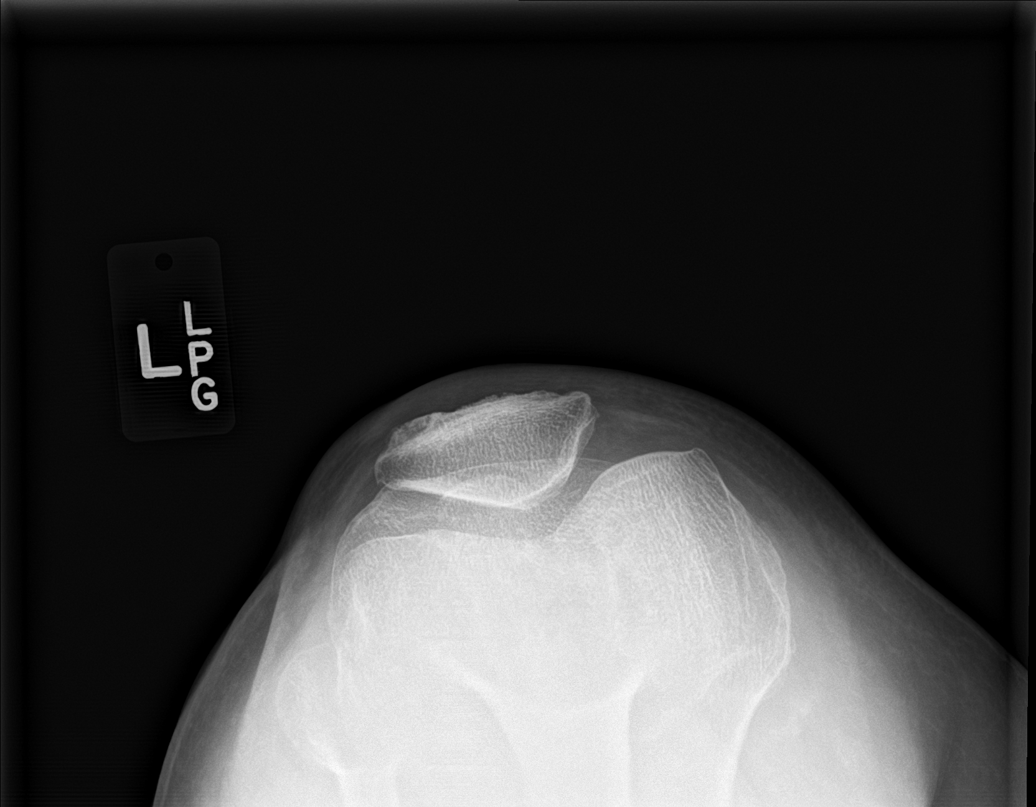

[5 of 5 positions shown; findings below may reference images not displayed]

FINDINGS: Minimally displaced fracture is seen involving the inferior portion
of the patella. No other fracture or dislocation is noted. Joint
spaces are intact. No definite joint effusion is noted.
IMPRESSION: Minimally displaced patellar fracture.

## 2017-10-11 HISTORY — PX: KNEE ARTHROSCOPY: SUR90

## 2017-10-13 DIAGNOSIS — S82009A Unspecified fracture of unspecified patella, initial encounter for closed fracture: Secondary | ICD-10-CM | POA: Insufficient documentation

## 2017-11-09 DIAGNOSIS — M25561 Pain in right knee: Secondary | ICD-10-CM | POA: Insufficient documentation

## 2018-01-17 ENCOUNTER — Other Ambulatory Visit: Payer: Self-pay

## 2018-01-17 ENCOUNTER — Ambulatory Visit (HOSPITAL_BASED_OUTPATIENT_CLINIC_OR_DEPARTMENT_OTHER): Payer: No Typology Code available for payment source | Admitting: Physical Medicine & Rehabilitation

## 2018-01-17 ENCOUNTER — Encounter: Payer: No Typology Code available for payment source | Attending: Physical Medicine & Rehabilitation

## 2018-01-17 ENCOUNTER — Encounter: Payer: Self-pay | Admitting: Physical Medicine & Rehabilitation

## 2018-01-17 VITALS — BP 132/84 | HR 69 | Ht 68.0 in | Wt 204.0 lb

## 2018-01-17 DIAGNOSIS — M5418 Radiculopathy, sacral and sacrococcygeal region: Secondary | ICD-10-CM | POA: Diagnosis not present

## 2018-01-17 DIAGNOSIS — M5416 Radiculopathy, lumbar region: Secondary | ICD-10-CM

## 2018-01-17 DIAGNOSIS — G8929 Other chronic pain: Secondary | ICD-10-CM | POA: Diagnosis not present

## 2018-01-17 DIAGNOSIS — M5136 Other intervertebral disc degeneration, lumbar region: Secondary | ICD-10-CM | POA: Diagnosis not present

## 2018-01-17 DIAGNOSIS — G8921 Chronic pain due to trauma: Secondary | ICD-10-CM | POA: Diagnosis not present

## 2018-01-17 MED ORDER — TRAMADOL HCL 50 MG PO TABS: 100.0000 mg | ORAL_TABLET | Freq: Two times a day (BID) | ORAL | 0 refills | Status: DC

## 2018-01-17 MED ORDER — PREGABALIN 150 MG PO CAPS: 150.0000 mg | ORAL_CAPSULE | Freq: Two times a day (BID) | ORAL | 1 refills | Status: DC

## 2018-01-17 NOTE — Progress Notes (Signed)
Subjective:    Patient ID: Emma Newman, female    DOB: [birth year], 57 y.o.   MRN: [id number]  HPI 57 year old female with history of work-related injury affecting her low back causing right-sided radiculopathy onset in 2006 while working for [employer].  He used to work at [employer] and is hoping to get a [employer] license. Patient has had a work-related patella fracture on the left side and has been treated by orthopedic surgeon Dr. Valma CavaAndrew Collins.  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  She is currently not working.  Her pain medicines consist of tramadol 100 mg twice daily as well as Lyrica 150 mg twice daily  We discussed the regulatory issues with tramadol and need for urine drug screen.  Pain Inventory Average Pain 3 Pain Right Now 3 My pain is intermittent, sharp, dull and tingling  In the last 24 hours, has pain interfered with the following? General activity 2 Relation with others 0 Enjoyment of life 2 What TIME of day is your pain at its worst? daytime night Sleep (in general) Fair  Pain is worse with: bending, sitting, inactivity and some activites Pain improves with: rest, heat/ice, therapy/exercise, pacing activities and medication Relief from Meds: 7  Mobility walk without assistance Do you have any goals in this area?  no  Function employed # of hrs/week 25  Neuro/Psych weakness numbness tingling  Prior Studies Any changes since last visit?  no  Physicians involved in your care Any changes since last visit?  no   Family History  Problem Relation Age of Onset  . Aneurysm Mother [id number]       died from complications of AAA  . Cancer Mother        breast cancer  . Cancer Father [id number]       mesothelioma  . Heart disease Father        cardiomyopathy   Social History   Socioeconomic History  . Marital status: Unknown    Spouse name: Not on file  . Number of children: Not on file  . Years  of education: Not on file  . Highest education level: Not on file  Occupational History  . Not on file  Social Needs  . Financial resource strain: Not on file  . Food insecurity:    Worry: Not on file    Inability: Not on file  . Transportation needs:    Medical: Not on file    Non-medical: Not on file  Tobacco Use  . Smoking status: Never Smoker  . Smokeless tobacco: Never Used  Substance and Sexual Activity  . Alcohol use: Yes    Comment: social  . Drug use: No  . Sexual activity: Not on file  Lifestyle  . Physical activity:    Days per week: Not on file    Minutes per session: Not on file  . Stress: Not on file  Relationships  . Social connections:    Talks on phone: Not on file    Gets together: Not on file    Attends religious service: Not on file    Active member of club or organization: Not on file    Attends meetings of clubs or organizations: Not on file    Relationship status: Not on file  Other Topics Concern  . Not on file  Social History Narrative  . Not on file   Past Surgical History:  Procedure Laterality Date  .  APPENDECTOMY     1981  . DIGIT NAIL REMOVAL Right 06/19/2015   Procedure: Removal right hallux nail plate;  Surgeon: Toni Arthurs, MD;  Location: [facility];  Service: Orthopedics;  Laterality: Right;  . ORIF TOE FRACTURE Right 06/19/2015   Procedure: Repair extensor  hallucus longus tendon to distal phalanx;  Surgeon: Toni Arthurs, MD;  Location: [facility];  Service: Orthopedics;  Laterality: Right;  . TONSILLECTOMY     Past Medical History:  Diagnosis Date  . Fibromyalgia   . Headache   . Herniated lumbar intervertebral disc    L4-S1  . Hypertension    BP 132/84   Pulse 69   Ht 5\' 8"  (1.727 m) Comment: pt reported  Wt 204 lb (92.5 kg)   LMP 07/05/2012   SpO2 97%   BMI 31.02 kg/m   Opioid Risk Score:   Fall Risk Score:  `1  Depression screen PHQ 2/9  Depression screen [facility] 2/9 01/17/2018 07/15/2017  01/21/2015  Decreased Interest 0 0 2  Down, Depressed, Hopeless 0 0 1  PHQ - 2 Score 0 0 3  Altered sleeping - - 1  Tired, decreased energy - - 1  Change in appetite - - 1  Feeling bad or failure about yourself  - - 0  Trouble concentrating - - 1  Moving slowly or fidgety/restless - - 0  Suicidal thoughts - - 0  PHQ-9 Score - - 7   Review of Systems  Constitutional: Negative.   HENT: Negative.   Eyes: Negative.   Respiratory: Negative.   Cardiovascular: Negative.   Gastrointestinal: Negative.   Endocrine: Negative.   Genitourinary: Negative.   Musculoskeletal: Negative.   Skin: Negative.   Allergic/Immunologic: Negative.   Neurological: Negative.   Hematological: Negative.   Psychiatric/Behavioral: Negative.   All other systems reviewed and are negative.      Objective:   Physical Exam  Constitutional: She is oriented to person, place, and time. She appears well-developed and well-nourished.  HENT:  Head: Normocephalic and atraumatic.  Eyes: Pupils are equal, round, and reactive to light. Conjunctivae and EOM are normal.  Neck: Normal range of motion.  Musculoskeletal: Normal range of motion.  Neurological: She is alert and oriented to person, place, and time. She displays no atrophy. A sensory deficit is present. She exhibits abnormal muscle tone. Coordination normal.  Reflex Scores:      Patellar reflexes are 2+ on the right side and 2+ on the left side.      Achilles reflexes are 0 on the right side and 0 on the left side. - SLR  Skin: Skin is warm and dry.  Psychiatric: She has a normal mood and affect. Her behavior is normal. Thought content normal.  Nursing note and vitals reviewed.         Assessment & Plan:  1.  Chronic radiculitis mainly S1 has been responsive to epidural steroid injections in the past. Currently doing well on her medication management consists of tramadol 100 mg twice daily as well as Lyrica 150 mg twice daily no need for  interventional pain management at the current time. Physical medicine rehab follow-up 6 months  Indication for chronic opioid: See above Medication and dose: See above # pills per month: 120  tramadol Last UDS date: 01/17/2018 Opioid Treatment Agreement signed (Y/N): Yes Opioid Treatment Agreement last reviewed with patient:  01/17/2018 [facility] reviewed this encounter (include red flags):  01/17/18 no red flags

## 2018-01-17 NOTE — Patient Instructions (Signed)
Will need urine drug screen 1 Month supply of tramadol Once urine test is checked we can call in 5 additional refills

## 2018-01-21 LAB — TOXASSURE SELECT 13 (MW), URINE

## 2018-01-23 ENCOUNTER — Telehealth: Payer: Self-pay | Admitting: *Deleted

## 2018-01-23 NOTE — Telephone Encounter (Signed)
Urine drug screen is consistent for Tramadol.

## 2018-01-24 NOTE — Telephone Encounter (Signed)
May cont current dose

## 2018-01-25 MED ORDER — TRAMADOL HCL 50 MG PO TABS: 100.0000 mg | ORAL_TABLET | Freq: Two times a day (BID) | ORAL | 5 refills | Status: DC

## 2018-01-25 NOTE — Telephone Encounter (Signed)
Refills called to pharmacy. Next appt is October 2019.  CSA/Opioid mailed to pt to sign and return.

## 2018-02-28 ENCOUNTER — Ambulatory Visit: Payer: Self-pay | Admitting: Orthopedic Surgery

## 2018-07-05 DIAGNOSIS — M25661 Stiffness of right knee, not elsewhere classified: Secondary | ICD-10-CM | POA: Insufficient documentation

## 2018-07-18 ENCOUNTER — Ambulatory Visit: Payer: No Typology Code available for payment source | Admitting: Physical Medicine & Rehabilitation

## 2018-07-18 ENCOUNTER — Encounter: Payer: No Typology Code available for payment source | Attending: Physical Medicine & Rehabilitation

## 2018-07-25 ENCOUNTER — Other Ambulatory Visit: Payer: Self-pay | Admitting: Physical Medicine & Rehabilitation

## 2018-08-01 ENCOUNTER — Encounter: Payer: Self-pay | Admitting: Physical Medicine & Rehabilitation

## 2018-08-01 ENCOUNTER — Other Ambulatory Visit: Payer: Self-pay

## 2018-08-01 ENCOUNTER — Encounter: Payer: No Typology Code available for payment source | Attending: Physical Medicine & Rehabilitation

## 2018-08-01 ENCOUNTER — Ambulatory Visit (HOSPITAL_BASED_OUTPATIENT_CLINIC_OR_DEPARTMENT_OTHER): Payer: No Typology Code available for payment source | Admitting: Physical Medicine & Rehabilitation

## 2018-08-01 VITALS — BP 143/88 | HR 61 | Ht 68.0 in | Wt 210.0 lb

## 2018-08-01 DIAGNOSIS — Z791 Long term (current) use of non-steroidal anti-inflammatories (NSAID): Secondary | ICD-10-CM | POA: Insufficient documentation

## 2018-08-01 DIAGNOSIS — M797 Fibromyalgia: Secondary | ICD-10-CM | POA: Diagnosis not present

## 2018-08-01 DIAGNOSIS — M79671 Pain in right foot: Secondary | ICD-10-CM | POA: Diagnosis not present

## 2018-08-01 DIAGNOSIS — Z79899 Other long term (current) drug therapy: Secondary | ICD-10-CM | POA: Insufficient documentation

## 2018-08-01 DIAGNOSIS — M5417 Radiculopathy, lumbosacral region: Secondary | ICD-10-CM | POA: Diagnosis not present

## 2018-08-01 DIAGNOSIS — G8921 Chronic pain due to trauma: Secondary | ICD-10-CM | POA: Insufficient documentation

## 2018-08-01 DIAGNOSIS — M5136 Other intervertebral disc degeneration, lumbar region: Secondary | ICD-10-CM | POA: Insufficient documentation

## 2018-08-01 DIAGNOSIS — I10 Essential (primary) hypertension: Secondary | ICD-10-CM | POA: Diagnosis not present

## 2018-08-01 DIAGNOSIS — M5137 Other intervertebral disc degeneration, lumbosacral region: Secondary | ICD-10-CM | POA: Insufficient documentation

## 2018-08-01 DIAGNOSIS — M5416 Radiculopathy, lumbar region: Secondary | ICD-10-CM | POA: Insufficient documentation

## 2018-08-01 DIAGNOSIS — M25561 Pain in right knee: Secondary | ICD-10-CM | POA: Diagnosis not present

## 2018-08-01 MED ORDER — TRAMADOL HCL 50 MG PO TABS: 100.0000 mg | ORAL_TABLET | Freq: Two times a day (BID) | ORAL | 5 refills | Status: DC

## 2018-08-01 MED ORDER — PREGABALIN 150 MG PO CAPS: 150.0000 mg | ORAL_CAPSULE | Freq: Two times a day (BID) | ORAL | 1 refills | Status: DC

## 2018-08-01 NOTE — Progress Notes (Signed)
Subjective:    Patient ID: Emma Newman, female    DOB: 1961-01-09, 57 y.o.   MRN: [id number]  HPI  Right knee arthroscopy for capsular tear Has finished therapy for this Took additional pain med from ortho for only 3 days post op  Takes occ Ibuprofen mainly for knee pain  Low back pain mild pain with some increase during yardwork activities  Right lateral foot numbness and occ pain chronic and only partially responsive to ESIs (S1 LEVEL)  Cont on Tramadol 100mg  BID Lyrica 150mg  BID Pain Inventory Average Pain 3 Pain Right Now 3 My pain is dull, stabbing and aching  In the last 24 hours, has pain interfered with the following? General activity 3 Relation with others 2 Enjoyment of life 5 What TIME of day is your pain at its worst? daytime Sleep (in general) Fair  Pain is worse with: bending, sitting and some activites Pain improves with: rest, therapy/exercise, pacing activities and medication Relief from Meds: 8  Mobility walk without assistance how many minutes can you walk? 20 ability to climb steps?  no do you drive?  yes  Function employed # of hrs/week 25  Neuro/Psych weakness numbness  Prior Studies Any changes since last visit?  no CT/MRI  Physicians involved in your care Any changes since last visit?  no   Family History  Problem Relation Age of Onset  . Aneurysm Mother 29       died from complications of AAA  . Cancer Mother        breast cancer  . Cancer Father 48       mesothelioma  . Heart disease Father        cardiomyopathy   Social History   Socioeconomic History  . Marital status: Unknown    Spouse name: Not on file  . Number of children: Not on file  . Years of education: Not on file  . Highest education level: Not on file  Occupational History  . Not on file  Social Needs  . Financial resource strain: Not on file  . Food insecurity:    Worry: Not on file    Inability: Not on file  . Transportation needs:   Medical: Not on file    Non-medical: Not on file  Tobacco Use  . Smoking status: Never Smoker  . Smokeless tobacco: Never Used  Substance and Sexual Activity  . Alcohol use: Yes    Comment: social  . Drug use: No  . Sexual activity: Not on file  Lifestyle  . Physical activity:    Days per week: Not on file    Minutes per session: Not on file  . Stress: Not on file  Relationships  . Social connections:    Talks on phone: Not on file    Gets together: Not on file    Attends religious service: Not on file    Active member of club or organization: Not on file    Attends meetings of clubs or organizations: Not on file    Relationship status: Not on file  Other Topics Concern  . Not on file  Social History Narrative  . Not on file   Past Surgical History:  Procedure Laterality Date  . APPENDECTOMY     1981  . DIGIT NAIL REMOVAL Right 06/19/2015   Procedure: Removal right hallux nail plate;  Surgeon: Toni Arthurs, MD;  Location: [facility];  Service: Orthopedics;  Laterality: Right;  . ORIF TOE FRACTURE  Right 06/19/2015   Procedure: Repair extensor  hallucus longus tendon to distal phalanx;  Surgeon: Toni Arthurs, MD;  Location: [facility];  Service: Orthopedics;  Laterality: Right;  . TONSILLECTOMY     Past Medical History:  Diagnosis Date  . Fibromyalgia   . Headache   . Herniated lumbar intervertebral disc    L4-S1  . Hypertension    BP (!) 143/88   Pulse 61   Ht 5\' 8"  (1.727 m)   Wt 210 lb (95.3 kg)   LMP 07/05/2012   SpO2 93%   BMI 31.93 kg/m   Opioid Risk Score:   Fall Risk Score:  `1  Depression screen PHQ 2/9  Depression screen [facility] 2/9 08/01/2018 01/17/2018 07/15/2017 01/21/2015  Decreased Interest 0 0 0 2  Down, Depressed, Hopeless 0 0 0 1  PHQ - 2 Score 0 0 0 3  Altered sleeping - - - 1  Tired, decreased energy - - - 1  Change in appetite - - - 1  Feeling bad or failure about yourself  - - - 0  Trouble concentrating - - - 1    Moving slowly or fidgety/restless - - - 0  Suicidal thoughts - - - 0  PHQ-9 Score - - - 7   Review of Systems  Constitutional: Positive for diaphoresis and unexpected weight change.  HENT: Negative.   Eyes: Negative.   Respiratory: Negative.   Cardiovascular: Negative.   Gastrointestinal: Negative.   Endocrine: Negative.   Genitourinary: Negative.   Musculoskeletal: Negative.   Skin: Negative.   Allergic/Immunologic: Negative.   Neurological: Negative.   Hematological: Negative.   Psychiatric/Behavioral: Negative.   All other systems reviewed and are negative.      Objective:   Physical Exam  Constitutional: She is oriented to person, place, and time. She appears well-developed and well-nourished.  HENT:  Head: Normocephalic and atraumatic.  Eyes: Pupils are equal, round, and reactive to light. EOM are normal.  Musculoskeletal: Normal range of motion.  Neurological: She is alert and oriented to person, place, and time.  Skin: Skin is warm and dry.  Psychiatric: She has a normal mood and affect.  Nursing note and vitals reviewed.   General no acute distress Mood and affect are appropriate Ambulates without assistive device no evidence of toe drag or knee stability Extremities without edema Lumbar range of motion 75% flexion 50% extension 50% lateral bending Negative straight leg raising Motor strength 5/5 bilateral hip flexor knee extensor ankle dorsiflexor      Assessment & Plan:  1.  Lumbar degenerative disc with chronic radiculopathy primarily right S1.  Overall doing very well on current pain medication regimen of tramadol 100 mg twice daily, 50 mg tabs 120 tablets/month Lyrica 150 mg twice daily, 60 tablets/month Last urine drug screen was April 2019, appropriate New CSA and Opioid consent today 08/01/18 Nurse practitioner visit in 6 months recommend urine drug screen at that time

## 2018-08-01 NOTE — Patient Instructions (Signed)

## 2019-01-29 ENCOUNTER — Telehealth: Payer: Self-pay | Admitting: Physical Medicine & Rehabilitation

## 2019-01-30 ENCOUNTER — Ambulatory Visit: Payer: Self-pay | Admitting: Physical Medicine & Rehabilitation

## 2019-01-30 NOTE — Telephone Encounter (Signed)
Please set up for virtual visit with either myself or Riley Lam

## 2019-01-30 NOTE — Telephone Encounter (Signed)
Dr. Wynn Banker is requesting virtual visit for medication refill

## 2019-01-30 NOTE — Telephone Encounter (Signed)
Patient has a visit on 4/24 with Kirsteins

## 2019-02-02 ENCOUNTER — Encounter: Payer: No Typology Code available for payment source | Attending: Physical Medicine & Rehabilitation

## 2019-02-02 ENCOUNTER — Encounter: Payer: Self-pay | Admitting: Physical Medicine & Rehabilitation

## 2019-02-02 ENCOUNTER — Ambulatory Visit (HOSPITAL_BASED_OUTPATIENT_CLINIC_OR_DEPARTMENT_OTHER): Payer: No Typology Code available for payment source | Admitting: Physical Medicine & Rehabilitation

## 2019-02-02 ENCOUNTER — Other Ambulatory Visit: Payer: Self-pay

## 2019-02-02 DIAGNOSIS — M5416 Radiculopathy, lumbar region: Secondary | ICD-10-CM

## 2019-02-02 DIAGNOSIS — M5137 Other intervertebral disc degeneration, lumbosacral region: Secondary | ICD-10-CM | POA: Diagnosis not present

## 2019-02-02 MED ORDER — PREGABALIN 150 MG PO CAPS: 150.0000 mg | ORAL_CAPSULE | Freq: Two times a day (BID) | ORAL | 1 refills | Status: DC

## 2019-02-02 MED ORDER — TRAMADOL HCL 50 MG PO TABS: 100.0000 mg | ORAL_TABLET | Freq: Two times a day (BID) | ORAL | 5 refills | Status: DC

## 2019-02-02 NOTE — Progress Notes (Signed)
Subjective:    Patient ID: Emma Newman, female    DOB: 1961/09/17, 58 y.o.   MRN: [id number] Pt consents to Televisit due to Covid restrictions HPI  58 year old female with a work-related injury while employed at [employer] in 2006.  She had MRIs demonstrated lumbar spondylosis as well as right disc protrusion at L4-5 contracting the L5 nerve root.  She has been treated in the past with combination of Ultram Lyrica and nortriptyline.  At one point she was on Talwin but she has been off of that for many years.  She has tried sacroiliac injections which were helpful right L5 transforaminal and then right S1 transforaminal epidural injections.  She had a better response with the S1 epidural despite her radiologic features. 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 General activity 5 Relation with others 0 Enjoyment of life 2 What TIME of day is your pain at its worst? daytime Sleep (in general) Fair  Pain is worse with: bending, some activites and when working Pain improves with: rest, heat/ice and medication Relief from Meds: 5  Mobility walk without assistance  Function employed # of hrs/week 25-45 what is your job? [employer]  Neuro/Psych weakness numbness spasms  Prior Studies Any changes since last visit?  no  Physicians involved in your care Any changes since last visit?  no   Family History  Problem Relation Age of Onset  . Aneurysm Mother 56       died from complications of AAA  . Cancer Mother        breast cancer  . Cancer Father 84       mesothelioma  . Heart disease Father        cardiomyopathy   Social History   Socioeconomic History  . Marital status: Unknown    Spouse name: Not on file  . Number of children: Not on file  . Years of education: Not on file  . Highest education level: Not on file  Occupational History  . Not on file  Social Needs  . Financial resource strain: Not on file  . Food insecurity:    Worry: Not on file    Inability: Not on file  . Transportation needs:    Medical: Not on file    Non-medical: Not on file  Tobacco Use  . Smoking status: Never Smoker  . Smokeless tobacco: Never Used  Substance and Sexual Activity  . Alcohol use: Yes    Comment: social  . Drug use: No  . Sexual activity: Not on file  Lifestyle  . Physical activity:    Days per week: Not on file    Minutes per session: Not on file  . Stress: Not on file  Relationships  . Social connections:    Talks on phone: Not on file  Gets together: Not on file    Attends religious service: Not on file    Active member of club or organization: Not on file    Attends meetings of clubs or organizations: Not on file    Relationship status: Not on file  Other Topics Concern  . Not on file  Social History Narrative  . Not on file   Past Surgical History:  Procedure Laterality Date  . APPENDECTOMY     1981  . DIGIT NAIL REMOVAL Right 06/19/2015   Procedure: Removal right hallux nail plate;  Surgeon: Toni ArthursJohn Hewitt, MD;  Location: [facility];  Service: Orthopedics;  Laterality: Right;  . ORIF TOE FRACTURE Right 06/19/2015   Procedure: Repair extensor  hallucus longus tendon to distal phalanx;  Surgeon: Toni ArthursJohn Hewitt, MD;  Location: [facility];  Service: Orthopedics;  Laterality: Right;  . TONSILLECTOMY     Past Medical  History:  Diagnosis Date  . Fibromyalgia   . Headache   . Herniated lumbar intervertebral disc    L4-S1  . Hypertension    LMP 07/05/2012   Opioid Risk Score:   Fall Risk Score:  `1  Depression screen PHQ 2/9  [facility] 02/02/2019 08/01/2018 01/17/2018 07/15/2017 01/21/2015  Decreased Interest 0 0 0 0 2  Down, Depressed, Hopeless 0 0 0 0 1  PHQ - 2 Score 0 0 0 0 3  Altered sleeping - - - - 1  Tired, decreased energy - - - - 1  Change in appetite - - - - 1  Feeling bad or failure about yourself  - - - - 0  Trouble concentrating - - - - 1  Moving slowly or fidgety/restless - - - - 0  Suicidal thoughts - - - - 0  PHQ-9 Score - - - - 7   Review of Systems  Constitutional: Negative.   HENT: Negative.   Eyes: Negative.   Respiratory: Negative.   Cardiovascular: Negative.   Gastrointestinal: Negative.   Endocrine: Negative.   Genitourinary: Negative.   Musculoskeletal:       Spasms  Skin: Negative.   Allergic/Immunologic: Negative.   Neurological: Positive for weakness and numbness.  Hematological: Negative.   Psychiatric/Behavioral: Negative.   All other systems reviewed and are negative.      Objective:   Physical Exam Nursing note reviewed.  Neurological:     Mental Status: She is alert and oriented to person, place, and time.  Psychiatric:        Mood and Affect: Mood normal.        Behavior: Behavior normal.   Televisit , exm limited        Assessment & Plan:  1.  Chronic low back pain with chronic S1 radiculitis right greater than left lower extremity. The patient has remained functional working without restrictions.  She has been on the following medications Tramadol 100 mg twice daily Lyrica 150 mg twice daily We discussed that she is at a mid level dose on both of these medications. We discussed that she can add a Tylenol to her tramadol dosing twice a day. Physical medicine rehab follow-up in 6 months Will need UDS at that time    Duration of Televisit 21min

## 2019-02-08 ENCOUNTER — Telehealth: Payer: Self-pay

## 2019-02-08 NOTE — Telephone Encounter (Signed)
Pt called stating that Lyrica and Tramadol should have been sent in for her last week but pharmacy does not have prescriptions. I called pharmacy and spoke with pharmacist given the prescriptions.

## 2019-02-20 ENCOUNTER — Encounter: Payer: Self-pay | Admitting: Podiatry

## 2019-02-20 ENCOUNTER — Ambulatory Visit: Payer: BLUE CROSS/BLUE SHIELD | Admitting: Podiatry

## 2019-02-20 ENCOUNTER — Other Ambulatory Visit: Payer: Self-pay

## 2019-02-20 VITALS — Temp 96.1°F

## 2019-02-20 DIAGNOSIS — M79675 Pain in left toe(s): Secondary | ICD-10-CM | POA: Diagnosis not present

## 2019-02-20 DIAGNOSIS — M79674 Pain in right toe(s): Secondary | ICD-10-CM | POA: Diagnosis not present

## 2019-02-20 DIAGNOSIS — L600 Ingrowing nail: Secondary | ICD-10-CM

## 2019-02-20 MED ORDER — HYDROCODONE-ACETAMINOPHEN 5-325 MG PO TABS: 1.0000 | ORAL_TABLET | Freq: Four times a day (QID) | ORAL | 0 refills | Status: DC | PRN

## 2019-02-20 MED ORDER — CEPHALEXIN 500 MG PO CAPS: 500.0000 mg | ORAL_CAPSULE | Freq: Three times a day (TID) | ORAL | 2 refills | Status: DC

## 2019-02-20 NOTE — Patient Instructions (Signed)

## 2019-02-25 NOTE — Progress Notes (Signed)
Subjective: 58 year old female presents the office today requesting toenail removal of bilateral third toenails on the lateral aspect as well as the right medial nail border.  She states these nail borders are causing pain and irritation.  They are sore on daily basis.  She denies any drainage or pus. Denies any systemic complaints such as fevers, chills, nausea, vomiting. No acute changes since last appointment, and no other complaints at this time.   Objective: AAO x3, NAD DP/PT pulses palpable bilaterally, CRT less than 3 seconds Incurvation is present on bilateral third digit lateral corner of the toenail as well as the right medial hallux toenail.  There is localized edema to the area there is no drainage or pus.  Faint erythema more from inflammation as opposed to infection.  There is no ascending cellulitis.  No malodor.  No open lesions. No open lesions or pre-ulcerative lesions.  No pain with calf compression, swelling, warmth, erythema  Assessment: Ingrown toenails  Plan: -All treatment options discussed with the patient including all alternatives, risks, complications.  -At this time, the patient is requesting partial nail removal with chemical matricectomy to the symptomatic portion of the nail. Risks and complications were discussed with the patient for which they understand and written consent was obtained. Under sterile conditions a total of 3 mL of a mixture of 2% lidocaine plain and 0.5% Marcaine plain was infiltrated in a digital block fashion. Once anesthetized, the skin was prepped in sterile fashion. A tourniquet was then applied. Next the lateral aspect of bilateral 3rd digit and medial aspect of hallux nail border was then sharply excised making sure to remove the entire offending nail border. Once the nails were ensured to be removed area was debrided and the underlying skin was intact. There is no purulence identified in the procedure. Next phenol was then applied under  standard conditions and copiously irrigated. Silvadene was applied. A dry sterile dressing was applied. After application of the dressing the tourniquet was removed and there is found to be an immediate capillary refill time to the digit. The patient tolerated the procedure well any complications. Post procedure instructions were discussed the patient for which he verbally understood. Follow-up in one week for nail check or sooner if any problems are to arise. Discussed signs/symptoms of infection and directed to call the office immediately should any occur or go directly to the emergency room. In the meantime, encouraged to call the office with any questions, concerns, changes symptoms. -Keflex -Patient encouraged to call the office with any questions, concerns, change in symptoms.   Vivi Barrack DPM

## 2019-03-02 ENCOUNTER — Other Ambulatory Visit: Payer: Self-pay

## 2019-03-02 ENCOUNTER — Encounter: Payer: Self-pay | Admitting: Podiatry

## 2019-03-02 ENCOUNTER — Ambulatory Visit: Payer: BLUE CROSS/BLUE SHIELD | Admitting: Podiatry

## 2019-03-02 VITALS — Temp 97.5°F

## 2019-03-02 DIAGNOSIS — M79674 Pain in right toe(s): Secondary | ICD-10-CM

## 2019-03-02 DIAGNOSIS — L600 Ingrowing nail: Secondary | ICD-10-CM

## 2019-03-02 DIAGNOSIS — M722 Plantar fascial fibromatosis: Secondary | ICD-10-CM

## 2019-03-02 DIAGNOSIS — M7660 Achilles tendinitis, unspecified leg: Secondary | ICD-10-CM

## 2019-03-02 DIAGNOSIS — M79675 Pain in left toe(s): Secondary | ICD-10-CM

## 2019-03-02 NOTE — Patient Instructions (Signed)

## 2019-03-07 NOTE — Progress Notes (Signed)
Subjective: Emma Newman is a 58 y.o.  female returns to office today for follow up evaluation after having left Hallux and bilateral 3rd digit nail avulsion performed. Patient has been soaking using epsom salts and applying topical antibiotic covered with bandaid daily.  She is noticed some bloody drainage but no pus.  Some occasional discomfort but overall this is improving.  Also she brought in her orthotics for me to evaluate today.  Patient denies fevers, chills, nausea, vomiting. Denies any calf pain, chest pain, SOB.   Objective:  Vitals: Reviewed  General: Well developed, nourished, in no acute distress, alert and oriented x3   Dermatology: Skin is warm, dry and supple bilateral.  Left hallux and bilateral third digit nail border appears to be clean, dry, with mild granular tissue and surrounding scab. There is no surrounding erythema, edema, drainage/purulence. The remaining nails appear unremarkable at this time. There are no other lesions or other signs of infection present.  Neurovascular status: Intact. No lower extremity swelling; No pain with calf compression bilateral.  Musculoskeletal: No tenderness to palpation of the nail folds. Muscular strength within normal limits bilateral.  No pain with Achilles tendon, plantar fascia.  Assesement and Plan: S/p partial nail avulsion, doing well.   -Continue soaking in epsom salts twice a day followed by antibiotic ointment and a band-aid. Can leave uncovered at night. Continue this until completely healed.  -If the area has not healed in 2 weeks, call the office for follow-up appointment, or sooner if any problems arise.  -Upon evaluation of her orthotics there is nothing arthroscopically.  Given history of Achilles tendon, plantar fasciitis likely think she needs new orthotics as she is on her feet on concrete surfaces.  We will check orthotic coverage for her. -Monitor for any signs/symptoms of infection. Call the office immediately if  any occur or go directly to the emergency room. Call with any questions/concerns.  Ovid Curd, DPM

## 2019-04-23 ENCOUNTER — Telehealth: Payer: Self-pay | Admitting: *Deleted

## 2019-04-23 NOTE — Telephone Encounter (Signed)
(  late entry) Prior auth submitted to [facility] for tramdol 50 mg #120 on 02/08/19.  Approval received 02/08/2019 through 02/08/2020.

## 2019-07-31 ENCOUNTER — Other Ambulatory Visit: Payer: Self-pay | Admitting: Physical Medicine & Rehabilitation

## 2019-07-31 NOTE — Telephone Encounter (Signed)
Recieved electronic medication refill request from patients pharmacy for lyrica medication.  According to new insurance legislation, this medication can only be E-scribed.

## 2019-08-03 ENCOUNTER — Ambulatory Visit: Payer: Self-pay | Admitting: Physical Medicine & Rehabilitation

## 2019-08-06 ENCOUNTER — Encounter
Payer: No Typology Code available for payment source | Attending: Physical Medicine & Rehabilitation | Admitting: Physical Medicine & Rehabilitation

## 2019-08-06 ENCOUNTER — Other Ambulatory Visit: Payer: Self-pay

## 2019-08-06 ENCOUNTER — Encounter: Payer: Self-pay | Admitting: Physical Medicine & Rehabilitation

## 2019-08-06 VITALS — BP 170/96 | HR 76 | Temp 97.7°F | Ht 69.0 in | Wt 202.0 lb

## 2019-08-06 DIAGNOSIS — Z5181 Encounter for therapeutic drug level monitoring: Secondary | ICD-10-CM | POA: Diagnosis not present

## 2019-08-06 DIAGNOSIS — G894 Chronic pain syndrome: Secondary | ICD-10-CM | POA: Diagnosis not present

## 2019-08-06 DIAGNOSIS — Z79891 Long term (current) use of opiate analgesic: Secondary | ICD-10-CM | POA: Diagnosis not present

## 2019-08-06 MED ORDER — PREGABALIN 150 MG PO CAPS: 150.0000 mg | ORAL_CAPSULE | Freq: Two times a day (BID) | ORAL | 1 refills | Status: DC

## 2019-08-06 MED ORDER — TRAMADOL HCL 50 MG PO TABS: 100.0000 mg | ORAL_TABLET | Freq: Two times a day (BID) | ORAL | 5 refills | Status: DC

## 2019-08-06 NOTE — Patient Instructions (Signed)

## 2019-08-06 NOTE — Progress Notes (Signed)
Subjective:    Patient ID: Emma Newman, female    DOB: 05-18-1961, 58 y.o.   MRN: [id number] 58 year old female with a work-related injury while employed at [employer] in 2006.  She had MRIs demonstrated lumbar spondylosis as well as right disc protrusion at L4-5 contracting the L5 nerve root.  She has been treated in the past with combination of Ultram Lyrica and nortriptyline.  At one point she was on Talwin but she has been off of that for many years.  She has tried sacroiliac injections which were helpful right L5 transforaminal and then right S1 transforaminal epidural injections.  She had a better response with the S1 epidural despite her radiologic features. 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  Still has problems with numbness on the outside border the right foot.  This has not increased.  She has had no weakness in the lower limbs.  She is able to get up and down into trucks She is independent with all her self-care and mobility She has had no side effects from her medication Current medications tramadol 100 mg twice daily Lyrica 150 mg twice daily Pain Inventory Average Pain 3 Pain Right Now 2 My pain is sharp, dull and tingling  In the last 24 hours, has pain interfered with the following? General activity 2 Relation with others 2 Enjoyment of life 3 What TIME of day is your pain at its worst? varies Sleep (in general) Fair  Pain is worse with: bending, sitting, inactivity and some activites Pain improves with: rest, heat/ice, pacing activities and medication Relief from Meds: 8  Mobility walk  without assistance how many minutes can you walk? 30 ability to climb steps?  yes do you drive?  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  no  Physicians involved in your care Any changes since last visit?  no   Family History  Problem Relation Age of Onset  . Aneurysm Mother 39       died from complications of AAA  . Cancer Mother        breast cancer  . Cancer Father 90       mesothelioma  . Heart disease Father        cardiomyopathy   Social History   Socioeconomic History  . Marital status: Unknown    Spouse name: Not on file  . Number of children: Not on file  . Years of education: Not on file  . Highest education level: Not on file  Occupational History  . Not on file  Social Needs  . Financial resource strain: Not on file  . Food insecurity    Worry: Not on file    Inability: Not on file  . Transportation needs    Medical: Not on file    Non-medical: Not on file  Tobacco Use  . Smoking status: Never Smoker  . Smokeless tobacco: Never Used  Substance and Sexual  Activity  . Alcohol use: Yes    Comment: social  . Drug use: No  . Sexual activity: Not on file  Lifestyle  . Physical activity    Days per week: Not on file    Minutes per session: Not on file  . Stress: Not on file  Relationships  . Social Musician on phone: Not on file    Gets together: Not on file    Attends religious service: Not on file    Active member of club or organization: Not on file    Attends meetings of clubs or organizations: Not on file    Relationship status: Not on file  Other Topics Concern  . Not on file  Social History Narrative  . Not on file   Past Surgical History:  Procedure Laterality Date  . APPENDECTOMY     1981  . DIGIT NAIL REMOVAL Right 06/19/2015   Procedure: Removal right hallux nail plate;  Surgeon:  Toni Arthurs, MD;  Location: [facility];  Service: Orthopedics;  Laterality: Right;  . ORIF TOE FRACTURE Right 06/19/2015   Procedure: Repair extensor  hallucus longus tendon to distal phalanx;  Surgeon: Toni Arthurs, MD;  Location: [facility];  Service: Orthopedics;  Laterality: Right;  . TONSILLECTOMY     Past Medical History:  Diagnosis Date  . Fibromyalgia   . Headache   . Herniated lumbar intervertebral disc    L4-S1  . Hypertension    BP (!) 170/96   Pulse 76   Temp 97.7 F (36.5 C)   Ht 5\' 9"  (1.753 m)   Wt 202 lb (91.6 kg)   LMP 07/05/2012   SpO2 95%   BMI 29.83 kg/m   Opioid Risk Score:   Fall Risk Score:  `1  Depression screen PHQ 2/9  Depression screen [facility] 2/9 02/02/2019 08/01/2018 01/17/2018 07/15/2017 01/21/2015  Decreased Interest 0 0 0 0 2  Down, Depressed, Hopeless 0 0 0 0 1  PHQ - 2 Score 0 0 0 0 3  Altered sleeping - - - - 1  Tired, decreased energy - - - - 1  Change in appetite - - - - 1  Feeling bad or failure about yourself  - - - - 0  Trouble concentrating - - - - 1  Moving slowly or fidgety/restless - - - - 0  Suicidal thoughts - - - - 0  PHQ-9 Score - - - - 7    Review of Systems  Constitutional: Positive for diaphoresis and unexpected weight change.  HENT: Negative.   Eyes: Negative.   Respiratory: Negative.   Cardiovascular: Negative.   Gastrointestinal: Negative.   Endocrine: Negative.   Genitourinary: Negative.   Musculoskeletal: Positive for back pain.  Skin: Negative.   Allergic/Immunologic: Negative.   Neurological: Positive for weakness and numbness.       Tingling  All other systems reviewed and are negative.      Objective:   Physical Exam Vitals signs and nursing note reviewed.  Constitutional:      Appearance: Normal appearance.  Eyes:     Extraocular Movements: Extraocular movements intact.     Conjunctiva/sclera: Conjunctivae normal.     Pupils: Pupils are equal, round, and reactive to  light.  Neurological:     General: No focal deficit present.     Mental Status: She is alert and oriented to person, place, and time. Mental status is at baseline.  Psychiatric:  Mood and Affect: Mood normal.        Behavior: Behavior normal.   Sensation is reduced to pinprick at bilateral L5 and S1 dermatome distribution. Deep tendon reflexes absent at the right ankle 1+ at the left ankle 2+ bilateral knees Negative straight leg raising No pain with foot ankle or knee range of motion Lumbar spine without evidence of tenderness to palpation bilaterally.         Assessment & Plan:  #1.  Lumbar degenerative disc as well as spondylosis.  She has chronic low back pain as well as chronic right greater than left lower extremity radicular pain.  She has signs of chronic right S1 radiculopathy. Continue tramadol 100 mg twice daily Continue Lyrica 150 mg twice daily Prescriptions renewed Encouraged to do back exercises PDMP reviewed UDS today RTC 6 months

## 2019-08-09 LAB — TOXASSURE SELECT,+ANTIDEPR,UR

## 2019-08-13 ENCOUNTER — Telehealth: Payer: Self-pay | Admitting: *Deleted

## 2019-08-13 NOTE — Telephone Encounter (Signed)
Urine drug screen for this encounter is consistent for prescribed medication 

## 2019-12-14 ENCOUNTER — Ambulatory Visit: Payer: BC Managed Care – PPO | Attending: Internal Medicine

## 2019-12-14 DIAGNOSIS — Z23 Encounter for immunization: Secondary | ICD-10-CM | POA: Insufficient documentation

## 2019-12-14 NOTE — Progress Notes (Signed)
   Covid-19 Vaccination Clinic  Name:  Emma Newman    MRN: [id number] DOB: 08-30-1961  12/14/2019  Emma Newman was observed post Covid-19 immunization for 15 minutes without incident. She was provided with Vaccine Information Sheet and instruction to access the V-Safe system.   Emma Newman was instructed to call 911 with any severe reactions post vaccine: Marland Kitchen Difficulty breathing  . Swelling of face and throat  . A fast heartbeat  . A bad rash all over body  . Dizziness and weakness   Immunizations Administered    Name Date Dose VIS Date Route   Pfizer COVID-19 Vaccine 12/14/2019  9:48 AM 0.3 mL 09/21/2019 Intramuscular   Manufacturer: [employer], [employer]   Lot: [id number]   NDC: [address]-1

## 2020-01-09 ENCOUNTER — Ambulatory Visit: Payer: BC Managed Care – PPO | Attending: Internal Medicine

## 2020-01-09 DIAGNOSIS — Z23 Encounter for immunization: Secondary | ICD-10-CM

## 2020-01-09 NOTE — Progress Notes (Signed)
   Covid-19 Vaccination Clinic  Name:  Emma Newman    MRN: [id number] DOB: [birth year]  01/09/2020  Ms. Waldrip was observed post Covid-19 immunization for 15 minutes without incident. She was provided with Vaccine Information Sheet and instruction to access the V-Safe system.   Ms. Dowd was instructed to call 911 with any severe reactions post vaccine: Marland Kitchen Difficulty breathing  . Swelling of face and throat  . A fast heartbeat  . A bad rash all over body  . Dizziness and weakness   Immunizations Administered    Name Date Dose VIS Date Route   Pfizer COVID-19 Vaccine 01/09/2020 10:31 AM 0.3 mL 09/21/2019 Intramuscular   Manufacturer: [employer], [employer]   Lot: [id number]   NDC: [address]-7

## 2020-01-19 ENCOUNTER — Other Ambulatory Visit: Payer: Self-pay | Admitting: Podiatry

## 2020-01-29 ENCOUNTER — Ambulatory Visit: Payer: BC Managed Care – PPO | Admitting: Physical Medicine & Rehabilitation

## 2020-02-07 ENCOUNTER — Encounter
Payer: No Typology Code available for payment source | Attending: Physical Medicine & Rehabilitation | Admitting: Physical Medicine & Rehabilitation

## 2020-02-07 ENCOUNTER — Other Ambulatory Visit: Payer: Self-pay

## 2020-02-07 ENCOUNTER — Encounter: Payer: Self-pay | Admitting: Physical Medicine & Rehabilitation

## 2020-02-07 VITALS — BP 170/87 | HR 59 | Temp 98.3°F | Ht 69.0 in | Wt 208.0 lb

## 2020-02-07 DIAGNOSIS — M461 Sacroiliitis, not elsewhere classified: Secondary | ICD-10-CM | POA: Diagnosis present

## 2020-02-07 DIAGNOSIS — M5416 Radiculopathy, lumbar region: Secondary | ICD-10-CM

## 2020-02-07 MED ORDER — TRAMADOL HCL 50 MG PO TABS: 100.0000 mg | ORAL_TABLET | Freq: Two times a day (BID) | ORAL | 5 refills | Status: DC

## 2020-02-07 MED ORDER — PREGABALIN 150 MG PO CAPS: 150.0000 mg | ORAL_CAPSULE | Freq: Two times a day (BID) | ORAL | 1 refills | Status: DC

## 2020-02-07 NOTE — Progress Notes (Signed)
Subjective:    Patient ID: Emma Newman, female    DOB: [birth year], 59 y.o.   MRN: [id number] 59 year old female with a work-related injury while employed at [employer] in 2006.  She had MRIs demonstrated lumbar spondylosis as well as right disc protrusion at L4-5 contracting the L5 nerve root.  She has been treated in the past with combination of Ultram Lyrica and nortriptyline.  At one point she was on Talwin but she has been off of that for many years.  She has tried sacroiliac injections which were helpful right L5 transforaminal and then right S1 transforaminal epidural injections.  She had a better response with the S1 epidural despite her radiologic features. 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  Her symptoms have been well managed on the following medications Tramadol 100mg  BID Lyrica 150 mg twice daily  The patient has had increasing left-sided low back and buttock pain over the last 1 week.  She has tried some Aleve for this which is partially helpful.  The pain is not getting worse with time but has not improved yet.  She has had no new activities over the last week or 2.  Pain Inventory Average Pain 4 Pain Right Now 5 My pain is sharp and stabbing  In the last 24 hours, has pain interfered with the following? General activity 3 Relation with others 1 Enjoyment of life 3 What TIME of day is your pain at its worst? all Sleep (in general) Fair  Pain is worse with: bending, sitting, inactivity and some activites Pain improves with: rest, heat/ice, therapy/exercise and medication Relief from Meds: 7  Mobility walk without assistance how many minutes can you walk? 45 ability to climb steps?   yes do you drive?  yes  Function employed # of hrs/week 30  Neuro/Psych weakness numbness spasms  Prior Studies Any changes since last visit?  no   Physicians involved in your care Any changes since last visit?  no   Family History  Problem Relation Age of Onset  . Aneurysm Mother 78       died from complications of AAA  . Cancer Mother        breast cancer  . Cancer Father 83       mesothelioma  . Heart disease Father        cardiomyopathy   Social History   Socioeconomic History  . Marital status: Unknown    Spouse name: Not on file  . Number of children: Not on file  . Years of education: Not on file  . Highest education level: Not on file  Occupational History  . Not on file  Tobacco Use  . Smoking status: Never Smoker  . Smokeless tobacco: Never Used  Substance and Sexual Activity  . Alcohol use: Yes    Comment: social  . Drug use: No  . Sexual activity: Not on file  Other Topics Concern  . Not on file  Social History Narrative  . Not on file   Social Determinants of Health   Financial Resource Strain:   . Difficulty of Paying Living Expenses:   Food Insecurity:   . Worried About [employer] of  Food in the Last Year:   . Ran Out of Food in the Last Year:   Transportation Needs:   . Freight forwarder (Medical):   Marland Kitchen Lack of Transportation (Non-Medical):   Physical Activity:   . Days of Exercise per Week:   . Minutes of Exercise per Session:   Stress:   . Feeling of Stress :   Social Connections:   . Frequency of Communication with Friends and Family:   . Frequency of Social Gatherings with Friends and Family:   . Attends Religious Services:   . Active Member of Clubs or Organizations:   . Attends Banker Meetings:   Marland Kitchen Marital Status:    Past Surgical History:  Procedure Laterality Date  . APPENDECTOMY     1981  . DIGIT NAIL REMOVAL Right 06/19/2015   Procedure: Removal right hallux nail plate;  Surgeon: Toni Arthurs,  MD;  Location: [facility];  Service: Orthopedics;  Laterality: Right;  . ORIF TOE FRACTURE Right 06/19/2015   Procedure: Repair extensor  hallucus longus tendon to distal phalanx;  Surgeon: Toni Arthurs, MD;  Location:  SUR[facility]ERY C[facility];  Service: Orthopedics;  Laterality: Right;  . TONSILLECTOMY     Past Medical History:  Diagnosis Date  . Fibromyalgia   . Headache   . Herniated lumbar intervertebral disc    L4-S1  . Hypertension    BP (!) 170/87   Pulse (!) 59   Temp 98.3 F (36.8 C)   Ht 5\' 9"  (1.753 m)   Wt 208 lb (94.3 kg)   LMP 07/05/2012   SpO2 96%   BMI 30.72 kg/m   Opioid Risk Score:   Fall Risk Score:  `1  Depression screen PHQ 2/9  Depression screen [facility] 2/9 02/02/2019 08/01/2018 01/17/2018 07/15/2017 01/21/2015  Decreased Interest 0 0 0 0 2  Down, Depressed, Hopeless 0 0 0 0 1  PHQ - 2 Score 0 0 0 0 3  Altered sleeping - - - - 1  Tired, decreased energy - - - - 1  Change in appetite - - - - 1  Feeling bad or failure about yourself  - - - - 0  Trouble concentrating - - - - 1  Moving slowly or fidgety/restless - - - - 0  Suicidal thoughts - - - - 0  PHQ-9 Score - - - - 7    Review of Systems     Objective:   Physical Exam Vitals and nursing note reviewed.  Constitutional:      Appearance: Normal appearance.  HENT:     Head: Normocephalic and atraumatic.  Eyes:     Extraocular Movements: Extraocular movements intact.     Conjunctiva/sclera: Conjunctivae normal.     Pupils: Pupils are equal, round, and reactive to light.  Musculoskeletal:        General: No swelling or tenderness.     Right lower leg: No edema.     Left lower leg: No edema.     Comments: Patient has tenderness around the left PSIS and L5 paraspinal. There is no pain at or above L4 on the left side. Negative Faber's on the left. Negative straight leg raising  Ambulates without assistive device no evidence of toe drag or knee instability  Skin:    General: Skin  is warm and dry.  Neurological:     Mental Status: She is alert and oriented to person, place, and time.     Comments: Motor strength is  normal in the lower extremities there is no evidence of atrophy in the lower extremities  Psychiatric:        Mood and Affect: Mood normal.        Behavior: Behavior normal.           Assessment & Plan:  1.  Chronic right S1 radiculitis she is doing relatively well on the current medications Tramadol 100 mg twice daily Lyrica 150 mg twice daily We will renew with 5 refills and recheck in 6 months.  We will check a UDS next visit  2.  Left sacroiliac pain previously relieved with sacroiliac injections she will continue Aleve 2 tablets twice daily x2 more weeks and she will call us at that time if she has had no improvement in her pain or worsening.  She may benefit from a repeat sacroiliac injection under fluoroscopic guidance.

## 2020-02-07 NOTE — Patient Instructions (Signed)
Call if left sided pain does not improve in 2 wks, we can schedule a Left sacroiliac injection under fluoro guidance  May use Aleve 2 tabs twice a day for 2 weeks , stop Aleve if stomach upset

## 2020-02-07 NOTE — Addendum Note (Signed)
Addended by: Erick Colace on: 02/07/2020 11:36 AM   Modules accepted: Orders

## 2020-08-06 ENCOUNTER — Other Ambulatory Visit: Payer: Self-pay | Admitting: Physical Medicine & Rehabilitation

## 2020-08-08 ENCOUNTER — Other Ambulatory Visit: Payer: Self-pay

## 2020-08-08 ENCOUNTER — Encounter: Payer: Self-pay | Admitting: Physical Medicine & Rehabilitation

## 2020-08-08 ENCOUNTER — Encounter
Payer: No Typology Code available for payment source | Attending: Physical Medicine & Rehabilitation | Admitting: Physical Medicine & Rehabilitation

## 2020-08-08 VITALS — BP 142/79 | HR 58 | Temp 98.7°F | Ht 68.0 in | Wt 197.0 lb

## 2020-08-08 DIAGNOSIS — M5137 Other intervertebral disc degeneration, lumbosacral region: Secondary | ICD-10-CM | POA: Insufficient documentation

## 2020-08-08 DIAGNOSIS — M5416 Radiculopathy, lumbar region: Secondary | ICD-10-CM

## 2020-08-08 DIAGNOSIS — M51379 Other intervertebral disc degeneration, lumbosacral region without mention of lumbar back pain or lower extremity pain: Secondary | ICD-10-CM

## 2020-08-08 DIAGNOSIS — G894 Chronic pain syndrome: Secondary | ICD-10-CM

## 2020-08-08 NOTE — Patient Instructions (Signed)
Cont with walking exercise

## 2020-08-08 NOTE — Progress Notes (Signed)
Subjective:    Patient ID: Emma Newman, female    DOB: 1961/03/18, 59 y.o.   MRN: [id number] work-related injury while employed at [employer] in 2006.  She had MRIs demonstrated lumbar spondylosis as well as right disc protrusion at L4-5 contracting the L5 nerve root.  She has been treated in the past with combination of Ultram Lyrica and nortriptyline.  At one point she was on Talwin but she has been off of that for many years.  She has tried sacroiliac injections which were helpful right L5 transforaminal and then right S1 transforaminal epidural injections.  She had a better response with the S1 epidural despite her radiologic features. HPI Works at [employer] partime but usually 30+ hrs per week.   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  Has tried reducing tramadol but then her back pain flareup  No new medical issues or new meds in the last 6 months  Pain Inventory Average Pain 2 Pain Right Now 2 My pain is sharp and stabbing  In the last 24 hours, has pain interfered with the following? General activity 1 Relation with others 1 Enjoyment of life 2 What TIME of day is your pain at its worst? evening Sleep (in general) Fair  Pain is worse with: bending, sitting, inactivity and some activites Pain improves with: rest, heat/ice, therapy/exercise, pacing activities and medication Relief from Meds: 7  Family History  Problem Relation Age of Onset  . Aneurysm Mother 38       died from complications of AAA  . Cancer Mother        breast cancer  . Cancer Father 28       mesothelioma  . Heart disease Father        cardiomyopathy   Social History   Socioeconomic History  . Marital status: Unknown    Spouse name: Not on file  . Number of children: Not on file  . Years of education: Not on file  . Highest education level: Not on file  Occupational History  . Not on file  Tobacco Use  . Smoking status:  Never Smoker  . Smokeless tobacco: Never Used  Substance and Sexual Activity  . Alcohol use: Yes    Comment: social  . Drug use: No  . Sexual activity: Not on file  Other Topics Concern  . Not on file  Social History Narrative  . Not on file   Social Determinants of Health   Financial Resource Strain:   . Difficulty of Paying Living Expenses: Not on file  Food Insecurity:   . Worried About Programme researcher, broadcasting/film/video in the Last Year: Not on file  . Ran Out of Food in the Last Year: Not on file  Transportation Needs:   . Lack of Transportation (Medical): Not on file  . Lack of Transportation (Non-Medical): Not on file  Physical Activity:   . Days of Exercise per Week: Not on file  . Minutes of Exercise per Session: Not on file  Stress:   . Feeling of Stress : Not on file  Social Connections:   . Frequency of Communication with Friends and Family: Not on file  . Frequency of Social Gatherings with Friends and Family: Not on file  . Attends Religious Services: Not on file  . Active Member of Clubs or Organizations: Not on file  . Attends Banker Meetings: Not on file  . Marital  Status: Not on file   Past Surgical History:  Procedure Laterality Date  . APPENDECTOMY     1981  . DIGIT NAIL REMOVAL Right 06/19/2015   Procedure: Removal right hallux nail plate;  Surgeon: Toni Arthurs, MD;  Location: [facility];  Service: Orthopedics;  Laterality: Right;  . ORIF TOE FRACTURE Right 06/19/2015   Procedure: Repair extensor  hallucus longus tendon to distal phalanx;  Surgeon: Toni Arthurs, MD;  Location: [facility];  Service: Orthopedics;  Laterality: Right;  . TONSILLECTOMY     Past Surgical History:  Procedure Laterality Date  . APPENDECTOMY     1981  . DIGIT NAIL REMOVAL Right 06/19/2015   Procedure: Removal right hallux nail plate;  Surgeon: Toni Arthurs, MD;  Location: [facility];  Service: Orthopedics;  Laterality: Right;  . ORIF  TOE FRACTURE Right 06/19/2015   Procedure: Repair extensor  hallucus longus tendon to distal phalanx;  Surgeon: Toni Arthurs, MD;  Location: [facility];  Service: Orthopedics;  Laterality: Right;  . TONSILLECTOMY     Past Medical History:  Diagnosis Date  . Fibromyalgia   . Headache   . Herniated lumbar intervertebral disc    L4-S1  . Hypertension    BP (!) 142/79   Pulse (!) 58   Temp 98.7 F (37.1 C)   Ht 5\' 8"  (1.727 m)   Wt 197 lb (89.4 kg)   LMP 07/05/2012   SpO2 94%   BMI 29.95 kg/m   Opioid Risk Score:   Fall Risk Score:  `1  Depression screen PHQ 2/9  Depression screen [facility] 2/9 02/02/2019 08/01/2018 01/17/2018 07/15/2017 01/21/2015  Decreased Interest 0 0 0 0 2  Down, Depressed, Hopeless 0 0 0 0 1  PHQ - 2 Score 0 0 0 0 3  Altered sleeping - - - - 1  Tired, decreased energy - - - - 1  Change in appetite - - - - 1  Feeling bad or failure about yourself  - - - - 0  Trouble concentrating - - - - 1  Moving slowly or fidgety/restless - - - - 0  Suicidal thoughts - - - - 0  PHQ-9 Score - - - - 7    Review of Systems  Constitutional: Negative.   HENT: Negative.   Eyes: Negative.   Respiratory: Negative.   Cardiovascular: Negative.   Gastrointestinal: Negative.   Endocrine: Negative.   Genitourinary: Negative.   Musculoskeletal: Negative.   Skin: Negative.   Allergic/Immunologic: Negative.   Neurological: Negative.   Hematological: Negative.   Psychiatric/Behavioral: Negative.   All other systems reviewed and are negative.      Objective:   Physical Exam Vitals and nursing note reviewed.  Constitutional:      Appearance: She is obese.  HENT:     Head: Normocephalic and atraumatic.  Eyes:     Extraocular Movements: Extraocular movements intact.     Conjunctiva/sclera: Conjunctivae normal.     Pupils: Pupils are equal, round, and reactive to light.  Skin:    General: Skin is warm and dry.  Neurological:     Mental Status: She is alert and  oriented to person, place, and time.  Psychiatric:        Mood and Affect: Mood normal.        Behavior: Behavior normal.    Patient has reduced sensation over the toes of the right foot especially digits 2 3 and 4. No foot intrinsic  atrophy bilaterally Negative straight leg raise bilaterally No evidence of knee effusion or ankle effusion. No pain with foot or ankle range of motion. Ambulates without assist device no evidence of toe drag or knee stability Lumbar spine without tenderness palpation in the paraspinal area or lumbar spinous processes. She has limited range of motion of around 75% with flexion extension lateral bending and rotation but no pain with range of motion other than leaning toward the right side causes some painful pulling sensation left paraspinal area       Assessment & Plan:  #1. Chronic right lumbosacral radiculopathy. No motor symptoms. Overall doing very well from a functional standpoint. Her pain is well controlled with tramadol 100 mg twice daily as well as Lyrica 150 mg 2 times daily we will refill both these medications Follow-up in 6 months. She has done well with S1 transforaminal injections if she has a severe flareup of her radicular pain this is certainly an option. She will see the nurse practitioner at next visit

## 2020-10-15 ENCOUNTER — Telehealth: Payer: Self-pay

## 2020-10-15 NOTE — Telephone Encounter (Signed)
Emma Newman has COVID and on a pain contract ([facility]). She has requested permission for her PCP to prescribe a Codeine containing  cough medicine?  Pcp Dr. Cathe Mons contacted [facility] [phone]). The office will fax over proof of Dx & request to prescribe  To fax number  [phone].  Patient advised once approved  to speak to the pharmacist for guidance. Before  taking the cough medicine  & pain medicine. (Per Jacalyn Lefevre NP).

## 2020-10-16 NOTE — Telephone Encounter (Signed)
Okay faxed and verbal given to Dr. Miguel Rota office staff. Note also faxed back today.

## 2021-02-06 ENCOUNTER — Encounter: Payer: No Typology Code available for payment source | Attending: Registered Nurse | Admitting: Registered Nurse

## 2021-02-06 ENCOUNTER — Encounter: Payer: Self-pay | Admitting: Registered Nurse

## 2021-02-06 ENCOUNTER — Other Ambulatory Visit: Payer: Self-pay

## 2021-02-06 VITALS — BP 137/83 | HR 71 | Temp 98.1°F | Ht 69.6 in | Wt 203.0 lb

## 2021-02-06 DIAGNOSIS — M5137 Other intervertebral disc degeneration, lumbosacral region: Secondary | ICD-10-CM | POA: Insufficient documentation

## 2021-02-06 DIAGNOSIS — M5416 Radiculopathy, lumbar region: Secondary | ICD-10-CM

## 2021-02-06 DIAGNOSIS — Z5181 Encounter for therapeutic drug level monitoring: Secondary | ICD-10-CM

## 2021-02-06 DIAGNOSIS — G894 Chronic pain syndrome: Secondary | ICD-10-CM | POA: Diagnosis present

## 2021-02-06 DIAGNOSIS — Z79891 Long term (current) use of opiate analgesic: Secondary | ICD-10-CM | POA: Diagnosis present

## 2021-02-06 DIAGNOSIS — M51379 Other intervertebral disc degeneration, lumbosacral region without mention of lumbar back pain or lower extremity pain: Secondary | ICD-10-CM

## 2021-02-06 MED ORDER — TRAMADOL HCL 50 MG PO TABS: 100.0000 mg | ORAL_TABLET | Freq: Two times a day (BID) | ORAL | 5 refills | Status: DC

## 2021-02-06 MED ORDER — PREGABALIN 150 MG PO CAPS: 150.0000 mg | ORAL_CAPSULE | Freq: Two times a day (BID) | ORAL | 5 refills | Status: DC

## 2021-02-06 NOTE — Progress Notes (Signed)
Subjective:    Patient ID: Emma Newman, female    DOB: 09-03-1961, 60 y.o.   MRN: [id number]  HPI: Emma Newman is a 60 y.o. female who returns for follow up appointment for chronic pain and medication refill. She states her pain is located in her lower back radiating into her buttocks and bilateral lower extremities R>L. She rates her pain 2. Her current exercise regime is walking, she was encouraged to increase her HEP as tolerated.   Ms. Larocque Morphine equivalent is 20.00 MME.  UDS ordered today.   Pain Inventory Average Pain 3 Pain Right Now 2 My pain is constant, sharp, dull, stabbing, tingling and aching  In the last 24 hours, has pain interfered with the following? General activity 2 Relation with others 1 Enjoyment of life 2 What TIME of day is your pain at its worst? morning  and night Sleep (in general) Fair  Pain is worse with: bending and inactivity Pain improves with: medication Relief from Meds: 7  Family History  Problem Relation Age of Onset  . Aneurysm Mother 44       died from complications of AAA  . Cancer Mother        breast cancer  . Cancer Father 19       mesothelioma  . Heart disease Father        cardiomyopathy   Social History   Socioeconomic History  . Marital status: Unknown    Spouse name: Not on file  . Number of children: Not on file  . Years of education: Not on file  . Highest education level: Not on file  Occupational History  . Not on file  Tobacco Use  . Smoking status: Never Smoker  . Smokeless tobacco: Never Used  Vaping Use  . Vaping Use: Never used  Substance and Sexual Activity  . Alcohol use: Yes    Comment: social  . Drug use: No  . Sexual activity: Not on file  Other Topics Concern  . Not on file  Social History Narrative  . Not on file   Social Determinants of Health   Financial Resource Strain: Not on file  Food Insecurity: Not on file  Transportation Needs: Not on file  Physical Activity: Not on file   Stress: Not on file  Social Connections: Not on file   Past Surgical History:  Procedure Laterality Date  . APPENDECTOMY     1981  . DIGIT NAIL REMOVAL Right 06/19/2015   Procedure: Removal right hallux nail plate;  Surgeon: Toni Arthurs, MD;  Location: [facility];  Service: Orthopedics;  Laterality: Right;  . KNEE ARTHROSCOPY Right 2019  . ORIF TOE FRACTURE Right 06/19/2015   Procedure: Repair extensor  hallucus longus tendon to distal phalanx;  Surgeon: Toni Arthurs, MD;  Location: [facility];  Service: Orthopedics;  Laterality: Right;  . TONSILLECTOMY     Past Surgical History:  Procedure Laterality Date  . APPENDECTOMY     1981  . DIGIT NAIL REMOVAL Right 06/19/2015   Procedure: Removal right hallux nail plate;  Surgeon: Toni Arthurs, MD;  Location: [facility];  Service: Orthopedics;  Laterality: Right;  . KNEE ARTHROSCOPY Right 2019  . ORIF TOE FRACTURE Right 06/19/2015   Procedure: Repair extensor  hallucus longus tendon to distal phalanx;  Surgeon: Toni Arthurs, MD;  Location: [facility];  Service: Orthopedics;  Laterality: Right;  . TONSILLECTOMY     Past Medical History:  Diagnosis Date  . Fibromyalgia   . Headache   . Herniated lumbar intervertebral disc    L4-S1  . Hypertension    BP 137/83   Pulse 71   Temp 98.1 F (36.7 C)   Wt 203 lb (92.1 kg)   LMP 07/05/2012   SpO2 96%   BMI 30.87 kg/m   Opioid Risk Score:   Fall Risk Score:  `1  Depression screen PHQ 2/9  Depression screen [facility] 2/9 02/06/2021 02/02/2019 08/01/2018 01/17/2018 07/15/2017 01/21/2015  Decreased Interest 0 0 0 0 0 2  Down, Depressed, Hopeless 0 0 0 0 0 1  PHQ - 2 Score 0 0 0 0 0 3  Altered sleeping - - - - - 1  Tired, decreased energy - - - - - 1  Change in appetite - - - - - 1  Feeling bad or failure about yourself  - - - - - 0  Trouble concentrating - - - - - 1  Moving slowly or fidgety/restless - - - - - 0  Suicidal thoughts - - - - -  0  PHQ-9 Score - - - - - 7    Review of Systems  Musculoskeletal: Positive for back pain.  All other systems reviewed and are negative.      Objective:   Physical Exam Vitals and nursing note reviewed.  Constitutional:      Appearance: Normal appearance.  Cardiovascular:     Rate and Rhythm: Normal rate and regular rhythm.     Pulses: Normal pulses.     Heart sounds: Normal heart sounds.  Pulmonary:     Effort: Pulmonary effort is normal.     Breath sounds: Normal breath sounds.  Musculoskeletal:     Cervical back: Normal range of motion and neck supple.     Comments: Normal Muscle Bulk and Muscle Testing Reveals:  Upper Extremities: Full ROM and Muscle Strength 5/5  Lower Extremities: Full ROM and Muscle Strength 5/5 Arises from Table with ease Narrow Based  Gait   Skin:    General: Skin is warm and dry.  Neurological:     Mental Status: She is alert and oriented to person, place, and time.  Psychiatric:        Mood and Affect: Mood normal.        Behavior: Behavior normal.           Assessment & Plan:  1. Lumbar Radiculitis: Continue Lyrica. Continue HEP as Tolerated. Continue to Monitor.  2. Degeneration of Lumbar : Continue HEP as Tolerated. Continue to Monitor.  3. Chronic Pain Syndrome: Refilled Tramadol 50 mg take two tablets twice a day as need for pain 120. Continue Lyrica. Continue to monitor.  We will continue the opioid monitoring program, this consists of regular clinic visits, examinations, urine drug screen, pill counts as well as use of [place] Controlled Substance Reporting system. A 12 month History has been reviewed on the [place] Controlled Substance Reporting System on 02/06/2021.   F/U in 6 months

## 2021-02-17 ENCOUNTER — Telehealth: Payer: Self-pay | Admitting: *Deleted

## 2021-02-17 LAB — TOXASSURE SELECT,+ANTIDEPR,UR

## 2021-02-17 NOTE — Telephone Encounter (Signed)
Urine drug screen for this encounter is consistent for prescribed medication 

## 2021-05-04 ENCOUNTER — Telehealth: Payer: Self-pay | Admitting: Registered Nurse

## 2021-05-04 NOTE — Telephone Encounter (Signed)
Patient called to report she had shoulder surgery last month and they gave her  Oxy Ace 5-325 - she is on contract with [place]. [phone] if you need to discuss

## 2021-08-07 ENCOUNTER — Encounter: Payer: No Typology Code available for payment source | Admitting: Registered Nurse

## 2021-08-14 ENCOUNTER — Other Ambulatory Visit: Payer: Self-pay

## 2021-08-14 ENCOUNTER — Encounter: Payer: No Typology Code available for payment source | Attending: Registered Nurse | Admitting: Registered Nurse

## 2021-08-14 ENCOUNTER — Encounter: Payer: Self-pay | Admitting: Registered Nurse

## 2021-08-14 VITALS — BP 144/83 | HR 76 | Ht 69.6 in | Wt 206.4 lb

## 2021-08-14 DIAGNOSIS — M5416 Radiculopathy, lumbar region: Secondary | ICD-10-CM | POA: Insufficient documentation

## 2021-08-14 DIAGNOSIS — Z79891 Long term (current) use of opiate analgesic: Secondary | ICD-10-CM | POA: Insufficient documentation

## 2021-08-14 DIAGNOSIS — G894 Chronic pain syndrome: Secondary | ICD-10-CM | POA: Insufficient documentation

## 2021-08-14 DIAGNOSIS — Z5181 Encounter for therapeutic drug level monitoring: Secondary | ICD-10-CM | POA: Insufficient documentation

## 2021-08-14 DIAGNOSIS — M5137 Other intervertebral disc degeneration, lumbosacral region: Secondary | ICD-10-CM | POA: Insufficient documentation

## 2021-08-14 MED ORDER — PREGABALIN 150 MG PO CAPS: 150.0000 mg | ORAL_CAPSULE | Freq: Two times a day (BID) | ORAL | 5 refills | Status: DC

## 2021-08-14 MED ORDER — TRAMADOL HCL 50 MG PO TABS
100.0000 mg | ORAL_TABLET | Freq: Two times a day (BID) | ORAL | 5 refills | Status: DC
Start: 2021-08-14 — End: 2022-02-24

## 2021-08-14 NOTE — Progress Notes (Signed)
Subjective:    Patient ID: Emma Newman, female    DOB: 09-May-1961, 60 y.o.   MRN: [id number]  HPI: DARNETTE LAMPRON is a 60 y.o. female who returns for follow up appointment for chronic pain and medication refill. She states her pain is located in her lower back and occasionally radiates into her right lower extremity.She rates her pain 3. Her current exercise regime is walking and performing stretching exercises.  Ms. Matthew Morphine equivalent is 20.00 MME.   Last UDS was Performed on 02/06/2021, it was consistent.     Pain Inventory Average Pain 3 Pain Right Now 3 My pain is sharp, dull, tingling, and aching  In the last 24 hours, has pain interfered with the following? General activity 2 Relation with others 2 Enjoyment of life 5 What TIME of day is your pain at its worst? evening Sleep (in general) Fair  Pain is worse with: bending, sitting, inactivity, and some activites Pain improves with: rest, heat/ice, and medication Relief from Meds: 3  Family History  Problem Relation Age of Onset   Aneurysm Mother 27       died from complications of AAA   Cancer Mother        breast cancer   Cancer Father 67       mesothelioma   Heart disease Father        cardiomyopathy   Social History   Socioeconomic History   Marital status: Unknown    Spouse name: Not on file   Number of children: Not on file   Years of education: Not on file   Highest education level: Not on file  Occupational History   Not on file  Tobacco Use   Smoking status: Never   Smokeless tobacco: Never  Vaping Use   Vaping Use: Never used  Substance and Sexual Activity   Alcohol use: Yes    Comment: social   Drug use: No   Sexual activity: Not on file  Other Topics Concern   Not on file  Social History Narrative   Not on file   Social Determinants of Health   Financial Resource Strain: Not on file  Food Insecurity: Not on file  Transportation Needs: Not on file  Physical Activity: Not on file   Stress: Not on file  Social Connections: Not on file   Past Surgical History:  Procedure Laterality Date   APPENDECTOMY     1981   DIGIT NAIL REMOVAL Right 06/19/2015   Procedure: Removal right hallux nail plate;  Surgeon: Toni Arthurs, MD;  Location: [facility];  Service: Orthopedics;  Laterality: Right;   KNEE ARTHROSCOPY Right 2019   ORIF TOE FRACTURE Right 06/19/2015   Procedure: Repair extensor  hallucus longus tendon to distal phalanx;  Surgeon: Toni Arthurs, MD;  Location: [facility];  Service: Orthopedics;  Laterality: Right;   TONSILLECTOMY     Past Surgical History:  Procedure Laterality Date   APPENDECTOMY     1981   DIGIT NAIL REMOVAL Right 06/19/2015   Procedure: Removal right hallux nail plate;  Surgeon: Toni Arthurs, MD;  Location: [facility];  Service: Orthopedics;  Laterality: Right;   KNEE ARTHROSCOPY Right 2019   ORIF TOE FRACTURE Right 06/19/2015   Procedure: Repair extensor  hallucus longus tendon to distal phalanx;  Surgeon: Toni Arthurs, MD;  Location: [facility];  Service: Orthopedics;  Laterality: Right;   TONSILLECTOMY     Past Medical History:  Diagnosis Date   Fibromyalgia    Headache    Herniated lumbar intervertebral disc    L4-S1   Hypertension    Ht 5' 9.6" (1.768 m)   Wt 206 lb 6.4 oz (93.6 kg)   LMP 07/05/2012   BMI 29.96 kg/m   Opioid Risk Score:   Fall Risk Score:  `1  Depression screen PHQ 2/9  Depression screen [facility] 2/9 08/14/2021 02/06/2021 02/02/2019 08/01/2018 01/17/2018 07/15/2017 01/21/2015  Decreased Interest 0 0 0 0 0 0 2  Down, Depressed, Hopeless 0 0 0 0 0 0 1  PHQ - 2 Score 0 0 0 0 0 0 3  Altered sleeping - - - - - - 1  Tired, decreased energy - - - - - - 1  Change in appetite - - - - - - 1  Feeling bad or failure about yourself  - - - - - - 0  Trouble concentrating - - - - - - 1  Moving slowly or fidgety/restless - - - - - - 0  Suicidal thoughts - - - - - - 0  PHQ-9 Score  - - - - - - 7     Review of Systems  Constitutional: Negative.   HENT: Negative.    Eyes: Negative.   Respiratory: Negative.    Cardiovascular: Negative.   Gastrointestinal: Negative.   Endocrine: Negative.   Genitourinary: Negative.   Musculoskeletal:  Positive for back pain, neck pain and neck stiffness.  Skin: Negative.   Allergic/Immunologic: Negative.   Neurological: Negative.   Hematological: Negative.   Psychiatric/Behavioral: Negative.        Objective:   Physical Exam Vitals and nursing note reviewed.  Constitutional:      Appearance: Normal appearance.  Cardiovascular:     Rate and Rhythm: Normal rate and regular rhythm.     Pulses: Normal pulses.     Heart sounds: Normal heart sounds.  Pulmonary:     Effort: Pulmonary effort is normal.     Breath sounds: Normal breath sounds.  Musculoskeletal:     Cervical back: Normal range of motion and neck supple.     Comments: Normal Muscle Bulk and Muscle Testing Reveals:  Upper Extremities: Right: Decreased ROM 90 Degrees and Muscle Strength 5/5 Left Upper Extremity: Full ROM and Muscle Strength5/5  Lumbar Paraspinal Tenderness: L-4-L-5 Mainly Right Side Lower Extremities: Full ROM and Muscle Strength 5/5 Arises from Chair with ease Narrow based  Gait     Skin:    General: Skin is warm and dry.  Neurological:     Mental Status: She is alert and oriented to person, place, and time.  Psychiatric:        Mood and Affect: Mood normal.        Behavior: Behavior normal.         Assessment & Plan:  1. Lumbar Radiculitis: Continue Lyrica. Continue HEP as Tolerated. Continue to Monitor. 08/14/2021 2. Degeneration of Lumbar : Continue HEP as Tolerated. Continue to Monitor. 08/14/2021 3. Chronic Pain Syndrome: Refilled Tramadol 50 mg take two tablets twice a day as need for pain 120. Continue Lyrica. Continue to monitor.  We will continue the opioid monitoring program, this consists of regular clinic visits,  examinations, urine drug screen, pill counts as well as use of [place] Controlled Substance Reporting system. A 12 month History has been reviewed on the [place] Controlled Substance Reporting System on 08/14/2021.    F/U in 6 months

## 2022-02-11 ENCOUNTER — Ambulatory Visit: Payer: BC Managed Care – PPO | Admitting: Registered Nurse

## 2022-02-12 ENCOUNTER — Encounter: Payer: BC Managed Care – PPO | Attending: Registered Nurse | Admitting: Registered Nurse

## 2022-02-23 ENCOUNTER — Other Ambulatory Visit: Payer: Self-pay | Admitting: Registered Nurse

## 2022-02-24 NOTE — Telephone Encounter (Signed)
PMP was Reviewed.  ?Tramadol e-scribed today.  ?Placed a call to Emma Newman regarding the above, she verbalizes understanding.  ?

## 2022-03-01 ENCOUNTER — Other Ambulatory Visit: Payer: Self-pay | Admitting: Registered Nurse

## 2022-03-02 ENCOUNTER — Ambulatory Visit: Payer: BC Managed Care – PPO | Admitting: Registered Nurse

## 2022-03-02 NOTE — Telephone Encounter (Signed)
Lyrica e-scribed today.  Ms. Flud is aware via My-Chart message.

## 2022-03-23 ENCOUNTER — Encounter: Payer: Self-pay | Admitting: Registered Nurse

## 2022-03-23 ENCOUNTER — Encounter: Payer: No Typology Code available for payment source | Attending: Registered Nurse | Admitting: Registered Nurse

## 2022-03-23 VITALS — BP 127/81 | HR 85 | Ht 69.5 in | Wt 206.8 lb

## 2022-03-23 DIAGNOSIS — M5416 Radiculopathy, lumbar region: Secondary | ICD-10-CM

## 2022-03-23 DIAGNOSIS — Z79891 Long term (current) use of opiate analgesic: Secondary | ICD-10-CM | POA: Diagnosis present

## 2022-03-23 DIAGNOSIS — G894 Chronic pain syndrome: Secondary | ICD-10-CM | POA: Diagnosis present

## 2022-03-23 DIAGNOSIS — Z5181 Encounter for therapeutic drug level monitoring: Secondary | ICD-10-CM | POA: Diagnosis present

## 2022-03-23 DIAGNOSIS — M51379 Other intervertebral disc degeneration, lumbosacral region without mention of lumbar back pain or lower extremity pain: Secondary | ICD-10-CM

## 2022-03-23 DIAGNOSIS — M5137 Other intervertebral disc degeneration, lumbosacral region: Secondary | ICD-10-CM | POA: Insufficient documentation

## 2022-03-23 MED ORDER — TRAMADOL HCL 50 MG PO TABS: 100.0000 mg | ORAL_TABLET | Freq: Two times a day (BID) | ORAL | 5 refills | Status: DC

## 2022-03-23 NOTE — Progress Notes (Signed)
Subjective:    Patient ID: Emma Newman, female    DOB: November 07, 1960, 61 y.o.   MRN: [id number]  HPI: Emma Newman is a 61 y.o. female who returns for follow up appointment for chronic pain and medication refill. She states her  pain is located in her lower back radiating into her right buttock. She. rates her pain 3. Her current exercise regime is walking and performing stretching exercises.  Emma Newman Morphine equivalent is 20.00 MME.   UDS ordered today.     Pain Inventory Average Pain 3 Pain Right Now 3 My pain is sharp, stabbing, tingling, and aching  In the last 24 hours, has pain interfered with the following? General activity 3 Relation with others 1 Enjoyment of life 3 What TIME of day is your pain at its worst? daytime Sleep (in general) Poor  Pain is worse with: bending, sitting, and some activites Pain improves with: rest, heat/ice, therapy/exercise, pacing activities, and medication Relief from Meds: 8  Family History  Problem Relation Age of Onset   Aneurysm Mother 81       died from complications of AAA   Cancer Mother        breast cancer   Cancer Father 56       mesothelioma   Heart disease Father        cardiomyopathy   Social History   Socioeconomic History   Marital status: Unknown    Spouse name: Not on file   Number of children: Not on file   Years of education: Not on file   Highest education level: Not on file  Occupational History   Not on file  Tobacco Use   Smoking status: Never   Smokeless tobacco: Never  Vaping Use   Vaping Use: Never used  Substance and Sexual Activity   Alcohol use: Yes    Comment: social   Drug use: No   Sexual activity: Not on file  Other Topics Concern   Not on file  Social History Narrative   Not on file   Social Determinants of Health   Financial Resource Strain: Not on file  Food Insecurity: Not on file  Transportation Needs: Not on file  Physical Activity: Not on file  Stress: Not on file  Social  Connections: Not on file   Past Surgical History:  Procedure Laterality Date   APPENDECTOMY     1981   DIGIT NAIL REMOVAL Right 06/19/2015   Procedure: Removal right hallux nail plate;  Surgeon: Toni Arthurs, MD;  Location: [facility];  Service: Orthopedics;  Laterality: Right;   KNEE ARTHROSCOPY Right 2019   ORIF TOE FRACTURE Right 06/19/2015   Procedure: Repair extensor  hallucus longus tendon to distal phalanx;  Surgeon: Toni Arthurs, MD;  Location: [facility];  Service: Orthopedics;  Laterality: Right;   TONSILLECTOMY     Past Surgical History:  Procedure Laterality Date   APPENDECTOMY     1981   DIGIT NAIL REMOVAL Right 06/19/2015   Procedure: Removal right hallux nail plate;  Surgeon: Toni Arthurs, MD;  Location: [facility];  Service: Orthopedics;  Laterality: Right;   KNEE ARTHROSCOPY Right 2019   ORIF TOE FRACTURE Right 06/19/2015   Procedure: Repair extensor  hallucus longus tendon to distal phalanx;  Surgeon: Toni Arthurs, MD;  Location: [facility];  Service: Orthopedics;  Laterality: Right;   TONSILLECTOMY     Past Medical History:  Diagnosis Date  Fibromyalgia    Headache    Herniated lumbar intervertebral disc    L4-S1   Hypertension    BP 127/81   Pulse 85   Ht 5' 9.5" (1.765 m)   Wt 206 lb 12.8 oz (93.8 kg)   LMP 07/05/2012   SpO2 92%   BMI 30.10 kg/m   Opioid Risk Score:   Fall Risk Score:  `1  Depression screen [facility] 2/9     08/14/2021   10:53 AM 02/06/2021   11:22 AM 02/02/2019   11:42 AM 08/01/2018    1:07 PM 01/17/2018    2:42 PM 07/15/2017    2:30 PM 01/21/2015    1:09 PM  Depression screen PHQ 2/9  Decreased Interest 0 0 0 0 0 0 2  Down, Depressed, Hopeless 0 0 0 0 0 0 1  PHQ - 2 Score 0 0 0 0 0 0 3  Altered sleeping       1  Tired, decreased energy       1  Change in appetite       1  Feeling bad or failure about yourself        0  Trouble concentrating       1  Moving slowly or  fidgety/restless       0  Suicidal thoughts       0  PHQ-9 Score       7     Review of Systems  Musculoskeletal:  Positive for back pain.       Right shoulder pain Right leg pain Right foot pain Left hamstring pain  All other systems reviewed and are negative.     Objective:   Physical Exam Vitals and nursing note reviewed.  Constitutional:      Appearance: Normal appearance.  Cardiovascular:     Rate and Rhythm: Normal rate and regular rhythm.     Pulses: Normal pulses.     Heart sounds: Normal heart sounds.  Pulmonary:     Effort: Pulmonary effort is normal.     Breath sounds: Normal breath sounds.  Musculoskeletal:     Cervical back: Normal range of motion and neck supple.     Comments: Normal Muscle Bulk and Muscle Testing Reveals:  Upper Extremities:Full  ROM and Muscle Strength 5/5 Right AC Joint Tenderness  Lower Extremities: Full ROM and Muscle Strength 5/5 Arises from Table with Ease Narrow Based  Gait     Skin:    General: Skin is warm and dry.  Neurological:     Mental Status: She is alert and oriented to person, place, and time.  Psychiatric:        Mood and Affect: Mood normal.        Behavior: Behavior normal.         Assessment & Plan:  1. Lumbar Radiculitis: Continue Lyrica. Continue HEP as Tolerated. Continue to Monitor. 03/23/2022 2. Degeneration of Lumbar : Continue HEP as Tolerated. Continue to Monitor. 03/23/2021 3. Chronic Pain Syndrome: Refilled Tramadol 50 mg take two tablets twice a day as need for pain 120. Continue Lyrica. Continue to monitor.  We will continue the opioid monitoring program, this consists of regular clinic visits, examinations, urine drug screen, pill counts as well as use of [place] Controlled Substance Reporting system. A 12 month History has been reviewed on the [place] Controlled Substance Reporting System on 03/23/2022    F/U in 6 months

## 2022-03-27 LAB — TOXASSURE SELECT,+ANTIDEPR,UR

## 2022-03-29 ENCOUNTER — Telehealth: Payer: Self-pay | Admitting: *Deleted

## 2022-03-29 NOTE — Telephone Encounter (Signed)
Urine drug screen for this encounter is consistent for prescribed medication 

## 2022-09-23 ENCOUNTER — Encounter: Payer: No Typology Code available for payment source | Admitting: Registered Nurse

## 2022-09-24 ENCOUNTER — Encounter
Payer: No Typology Code available for payment source | Attending: Registered Nurse | Admitting: Physical Medicine & Rehabilitation

## 2022-09-24 ENCOUNTER — Encounter: Payer: Self-pay | Admitting: Physical Medicine & Rehabilitation

## 2022-09-24 VITALS — BP 143/88 | HR 74 | Ht 69.5 in | Wt 203.0 lb

## 2022-09-24 DIAGNOSIS — M5137 Other intervertebral disc degeneration, lumbosacral region: Secondary | ICD-10-CM | POA: Diagnosis present

## 2022-09-24 DIAGNOSIS — G894 Chronic pain syndrome: Secondary | ICD-10-CM

## 2022-09-24 DIAGNOSIS — M5416 Radiculopathy, lumbar region: Secondary | ICD-10-CM | POA: Diagnosis present

## 2022-09-24 DIAGNOSIS — M51379 Other intervertebral disc degeneration, lumbosacral region without mention of lumbar back pain or lower extremity pain: Secondary | ICD-10-CM

## 2022-09-24 MED ORDER — PREGABALIN 150 MG PO CAPS: 150.0000 mg | ORAL_CAPSULE | Freq: Two times a day (BID) | ORAL | 5 refills | Status: DC

## 2022-09-24 MED ORDER — TRAMADOL HCL 50 MG PO TABS: 100.0000 mg | ORAL_TABLET | Freq: Two times a day (BID) | ORAL | 5 refills | Status: DC

## 2022-09-24 NOTE — Patient Instructions (Signed)
Back Exercises These exercises help to make your trunk and back strong. They also help to keep the lower back flexible. Doing these exercises can help to prevent or lessen pain in your lower back. If you have back pain, try to do these exercises 2-3 times each day or as told by your doctor. As you get better, do the exercises once each day. Repeat the exercises more often as told by your doctor. To stop back pain from coming back, do the exercises once each day, or as told by your doctor. Do exercises exactly as told by your doctor. Stop right away if you feel sudden pain or your pain gets worse. Exercises Single knee to chest Do these steps 3-5 times in a row for each leg: Lie on your back on a firm bed or the floor with your legs stretched out. Bring one knee to your chest. Grab your knee or thigh with both hands and hold it in place. Pull on your knee until you feel a gentle stretch in your lower back or butt. Keep doing the stretch for 10-30 seconds. Slowly let go of your leg and straighten it. Pelvic tilt Do these steps 5-10 times in a row: Lie on your back on a firm bed or the floor with your legs stretched out. Bend your knees so they point up to the ceiling. Your feet should be flat on the floor. Tighten your lower belly (abdomen) muscles to press your lower back against the floor. This will make your tailbone point up to the ceiling instead of pointing down to your feet or the floor. Stay in this position for 5-10 seconds while you gently tighten your muscles and breathe evenly. Cat-cow Do these steps until your lower back bends more easily: Get on your hands and knees on a firm bed or the floor. Keep your hands under your shoulders, and keep your knees under your hips. You may put padding under your knees. Let your head hang down toward your chest. Tighten (contract) the muscles in your belly. Point your tailbone toward the floor so your lower back becomes rounded like the back of a  cat. Stay in this position for 5 seconds. Slowly lift your head. Let the muscles of your belly relax. Point your tailbone up toward the ceiling so your back forms a sagging arch like the back of a cow. Stay in this position for 5 seconds.  Press-ups Do these steps 5-10 times in a row: Lie on your belly (face-down) on a firm bed or the floor. Place your hands near your head, about shoulder-width apart. While you keep your back relaxed and keep your hips on the floor, slowly straighten your arms to raise the top half of your body and lift your shoulders. Do not use your back muscles. You may change where you place your hands to make yourself more comfortable. Stay in this position for 5 seconds. Keep your back relaxed. Slowly return to lying flat on the floor.  Bridges Do these steps 10 times in a row: Lie on your back on a firm bed or the floor. Bend your knees so they point up to the ceiling. Your feet should be flat on the floor. Your arms should be flat at your sides, next to your body. Tighten your butt muscles and lift your butt off the floor until your waist is almost as high as your knees. If you do not feel the muscles working in your butt and the back of   your thighs, slide your feet 1-2 inches (2.5-5 cm) farther away from your butt. Stay in this position for 3-5 seconds. Slowly lower your butt to the floor, and let your butt muscles relax. If this exercise is too easy, try doing it with your arms crossed over your chest. Belly crunches Do these steps 5-10 times in a row: Lie on your back on a firm bed or the floor with your legs stretched out. Bend your knees so they point up to the ceiling. Your feet should be flat on the floor. Cross your arms over your chest. Tip your chin a little bit toward your chest, but do not bend your neck. Tighten your belly muscles and slowly raise your chest just enough to lift your shoulder blades a tiny bit off the floor. Avoid raising your body  higher than that because it can put too much stress on your lower back. Slowly lower your chest and your head to the floor. Back lifts Do these steps 5-10 times in a row: Lie on your belly (face-down) with your arms at your sides, and rest your forehead on the floor. Tighten the muscles in your legs and your butt. Slowly lift your chest off the floor while you keep your hips on the floor. Keep the back of your head in line with the curve in your back. Look at the floor while you do this. Stay in this position for 3-5 seconds. Slowly lower your chest and your face to the floor. Contact a doctor if: Your back pain gets a lot worse when you do an exercise. Your back pain does not get better within 2 hours after you exercise. If you have any of these problems, stop doing the exercises. Do not do them again unless your doctor says it is okay. Get help right away if: You have sudden, very bad back pain. If this happens, stop doing the exercises. Do not do them again unless your doctor says it is okay. This information is not intended to replace advice given to you by your health care provider. Make sure you discuss any questions you have with your health care provider. Document Revised: 12/10/2020 Document Reviewed: 12/10/2020 Elsevier Patient Education  2023 [employer] Inc[employer]  

## 2022-09-24 NOTE — Progress Notes (Signed)
Subjective:    Patient ID: Emma Newman, female    DOB: 06-18-1961, 61 y.o.   MRN: [id number]  HPI  62 you female with chronic low back  pain and intermittent right greater than left radicular pain going down into the feet.  The patient does have chronic paresthesias in the right foot. 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  She works approximately 30 hours a week although during busy season she may work more. Her goal is to work until she is 61 years old. Her pain is well-controlled with Lyrica 150 mg twice daily as well as tramadol 100 mg twice daily, the patient has had no side effects from these medications Pain Inventory Average Pain 4 Pain Right Now 4 My pain is constant, sharp, stabbing, and aching  In the last 24 hours, has pain interfered with the following? General activity 3 Relation with others 2 Enjoyment of life 3 What TIME of day is your pain at its worst? daytime Sleep (in general) Poor  Pain is worse with: bending, sitting, inactivity, and some activites Pain improves with: rest, heat/ice, pacing activities, and medication Relief from Meds: 7  Family History  Problem Relation Age of Onset   Aneurysm Mother 85       died from complications of AAA   Cancer Mother        breast cancer   Cancer Father 34       mesothelioma   Heart disease Father        cardiomyopathy   Social History   Socioeconomic History   Marital status: Unknown    Spouse name: Not on file   Number of children: Not on file   Years of education: Not on file   Highest education level: Not on file  Occupational History   Not on file  Tobacco Use   Smoking status: Never   Smokeless tobacco: Never  Vaping Use   Vaping Use: Never used  Substance and Sexual Activity   Alcohol use: Yes    Comment: social   Drug use: No   Sexual activity: Not on file  Other Topics Concern   Not on file  Social History Narrative   Not on file   Social Determinants  of Health   Financial Resource Strain: Not on file  Food Insecurity: Not on file  Transportation Needs: Not on file  Physical Activity: Not on file  Stress: Not on file  Social Connections: Not on file   Past Surgical History:  Procedure Laterality Date   APPENDECTOMY     1981   DIGIT NAIL REMOVAL Right 06/19/2015   Procedure: Removal right hallux nail plate;  Surgeon: Toni Arthurs, MD;  Location: [facility];  Service: Orthopedics;  Laterality: Right;   KNEE ARTHROSCOPY Right 2019   ORIF TOE FRACTURE Right 06/19/2015   Procedure: Repair extensor  hallucus longus tendon to distal phalanx;  Surgeon: Toni Arthurs, MD;  Location: [facility];  Service: Orthopedics;  Laterality: Right;   TONSILLECTOMY     Past Surgical History:  Procedure Laterality Date   APPENDECTOMY     1981   DIGIT NAIL REMOVAL Right 06/19/2015   Procedure: Removal right hallux nail plate;  Surgeon: Toni Arthurs, MD;  [facility];  Service: Orthopedics;  Laterality: Right;   KNEE ARTHROSCOPY Right 2019   ORIF TOE FRACTURE Right 06/19/2015   Procedure: Repair extensor  hallucus longus tendon to  distal phalanx;  Surgeon: Toni Arthurs, MD;  Location: [facility];  Service: Orthopedics;  Laterality: Right;   TONSILLECTOMY     Past Medical History:  Diagnosis Date   Fibromyalgia    Headache    Herniated lumbar intervertebral disc    L4-S1   Hypertension    Ht 5' 9.5" (1.765 m)   Wt 203 lb (92.1 kg)   LMP 07/05/2012   BMI 29.55 kg/m   Opioid Risk Score:   Fall Risk Score:  `1  Depression screen PHQ 2/9     09/24/2022    1:24 PM 08/14/2021   10:53 AM 02/06/2021   11:22 AM 02/02/2019   11:42 AM 08/01/2018    1:07 PM 01/17/2018    2:42 PM 07/15/2017    2:30 PM  Depression screen PHQ 2/9  Decreased Interest 0 0 0 0 0 0 0  Down, Depressed, Hopeless 0 0 0 0 0 0 0  PHQ - 2 Score 0 0 0 0 0 0 0    Review of Systems  Eyes:  Positive for visual  disturbance.  Musculoskeletal:  Positive for back pain.  All other systems reviewed and are negative.      Objective:   Physical Exam General no acute distress Mood and affect are appropriate Ambulates without assist device no evidence of toe drag or knee instability Motor strength is 5/5 bilateral hip flexor knee extensor ankle dorsiflexor and plantar flexor. Patient with mild lumbar tenderness) left side to palpation.  She has normal lumbar flexion and extension has mild pain with extension.  No pain with lateral bending. RIght L5 dermatomal sensory loss to pinprick Mild reduction bilateral S1  Negative SLR     Assessment & Plan:  1.  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  Symptoms have been well-controlled with tramadol 100 mg twice daily and Lyrica 150 mg twice daily.  Patient has had no signs of misuse, no side effects.  Continue current dose.  Have checked PDMP no red flags. Repeat urine toxicology at next visit.

## 2022-10-17 ENCOUNTER — Other Ambulatory Visit: Payer: Self-pay | Admitting: Registered Nurse

## 2022-10-28 ENCOUNTER — Ambulatory Visit: Payer: Self-pay

## 2022-10-28 ENCOUNTER — Other Ambulatory Visit: Payer: Self-pay | Admitting: Family Medicine

## 2022-10-28 DIAGNOSIS — M25562 Pain in left knee: Secondary | ICD-10-CM

## 2023-03-23 ENCOUNTER — Encounter: Payer: No Typology Code available for payment source | Attending: Registered Nurse | Admitting: Registered Nurse

## 2023-03-23 ENCOUNTER — Encounter: Payer: Self-pay | Admitting: Registered Nurse

## 2023-03-23 VITALS — BP 168/90 | HR 64 | Ht 69.5 in | Wt 209.0 lb

## 2023-03-23 DIAGNOSIS — Z79891 Long term (current) use of opiate analgesic: Secondary | ICD-10-CM | POA: Insufficient documentation

## 2023-03-23 DIAGNOSIS — M5416 Radiculopathy, lumbar region: Secondary | ICD-10-CM | POA: Diagnosis present

## 2023-03-23 DIAGNOSIS — I10 Essential (primary) hypertension: Secondary | ICD-10-CM | POA: Diagnosis present

## 2023-03-23 DIAGNOSIS — Z5181 Encounter for therapeutic drug level monitoring: Secondary | ICD-10-CM | POA: Insufficient documentation

## 2023-03-23 DIAGNOSIS — M5137 Other intervertebral disc degeneration, lumbosacral region: Secondary | ICD-10-CM | POA: Insufficient documentation

## 2023-03-23 DIAGNOSIS — G894 Chronic pain syndrome: Secondary | ICD-10-CM | POA: Diagnosis not present

## 2023-03-23 MED ORDER — TRAMADOL HCL 50 MG PO TABS: 100.0000 mg | ORAL_TABLET | Freq: Two times a day (BID) | ORAL | 5 refills | Status: DC

## 2023-03-23 NOTE — Progress Notes (Signed)
Subjective:    Patient ID: Emma Newman, female    DOB: 1961-09-24, 62 y.o.   MRN: [id number]  HPI: Emma Newman is a 62 y.o. female who returns for follow up appointment for chronic pain and medication refill. She states her pain is located in her lower back pain radiating into her right buttock. She rates her pain 4. Her current exercise regime is walking and performing stretching exercises.  Ms. Lauro Morphine equivalent is 40.00 MME.   UDS ordered today.     Pain Inventory Average Pain 4 Pain Right Now 4 My pain is sharp, burning, stabbing, and aching  In the last 24 hours, has pain interfered with the following? General activity 4 Relation with others 4 Enjoyment of life 5 What TIME of day is your pain at its worst? daytime, evening, and night Sleep (in general) Poor  Pain is worse with: bending, sitting, inactivity, and some activites Pain improves with: rest, therapy/exercise, pacing activities, and medication Relief from Meds: 6  Family History  Problem Relation Age of Onset   Aneurysm Mother 22       died from complications of AAA   Cancer Mother        breast cancer   Cancer Father 60       mesothelioma   Heart disease Father        cardiomyopathy   Social History   Socioeconomic History   Marital status: Unknown    Spouse name: Not on file   Number of children: Not on file   Years of education: Not on file   Highest education level: Not on file  Occupational History   Not on file  Tobacco Use   Smoking status: Never   Smokeless tobacco: Never  Vaping Use   Vaping Use: Never used  Substance and Sexual Activity   Alcohol use: Yes    Comment: social   Drug use: No   Sexual activity: Not on file  Other Topics Concern   Not on file  Social History Narrative   Not on file   Social Determinants of Health   Financial Resource Strain: Not on file  Food Insecurity: Not on file  Transportation Needs: Not on file  Physical Activity: Not on file   Stress: Not on file  Social Connections: Not on file   Past Surgical History:  Procedure Laterality Date   APPENDECTOMY     1981   DIGIT NAIL REMOVAL Right 06/19/2015   Procedure: Removal right hallux nail plate;  Surgeon: Toni Arthurs, MD;  Location: [facility];  Service: Orthopedics;  Laterality: Right;   KNEE ARTHROSCOPY Right 2019   ORIF TOE FRACTURE Right 06/19/2015   Procedure: Repair extensor  hallucus longus tendon to distal phalanx;  Surgeon: Toni Arthurs, MD;  Location: [facility];  Service: Orthopedics;  Laterality: Right;   TONSILLECTOMY     Past Surgical History:  Procedure Laterality Date   APPENDECTOMY     1981   DIGIT NAIL REMOVAL Right 06/19/2015   Procedure: Removal right hallux nail plate;  Surgeon: Toni Arthurs, MD;  Location: [facility];  Service: Orthopedics;  Laterality: Right;   KNEE ARTHROSCOPY Right 2019   ORIF TOE FRACTURE Right 06/19/2015   Procedure: Repair extensor  hallucus longus tendon to distal phalanx;  Surgeon: Toni Arthurs, MD;  Location: [facility];  Service: Orthopedics;  Laterality: Right;   TONSILLECTOMY     Past Medical History:  Diagnosis  Date   Fibromyalgia    Headache    Herniated lumbar intervertebral disc    L4-S1   Hypertension    BP (!) 150/88   Pulse 64   Ht 5' 9.5" (1.765 m)   Wt 209 lb (94.8 kg)   LMP 07/05/2012   SpO2 96%   BMI 30.42 kg/m   Opioid Risk Score:   Fall Risk Score:  `1  Depression screen PHQ 2/9     09/24/2022    1:24 PM 08/14/2021   10:53 AM 02/06/2021   11:22 AM 02/02/2019   11:42 AM 08/01/2018    1:07 PM 01/17/2018    2:42 PM 07/15/2017    2:30 PM  Depression screen PHQ 2/9  Decreased Interest 0 0 0 0 0 0 0  Down, Depressed, Hopeless 0 0 0 0 0 0 0  PHQ - 2 Score 0 0 0 0 0 0 0     Review of Systems  Musculoskeletal:  Positive for back pain.       Buttocks pain Pain in back of right thigh  All other systems reviewed and are negative.      Objective:   Physical Exam Vitals and nursing note reviewed.  Constitutional:      Appearance: Normal appearance.  Cardiovascular:     Rate and Rhythm: Normal rate and regular rhythm.     Pulses: Normal pulses.     Heart sounds: Normal heart sounds.  Pulmonary:     Effort: Pulmonary effort is normal.     Breath sounds: Normal breath sounds.  Musculoskeletal:     Cervical back: Normal range of motion and neck supple.     Comments: Normal Muscle Bulk and Muscle Testing Reveals:  Upper Extremities: Full ROM and Muscle Strength 5/5 Lumbar Paraspinal Tenderness: L-4-L-5 Mainly Right Side  Lower Extremities: Full ROM a nd Muscle Strength 5/5 Arises from Table with ease Narrow Based  Gait     Skin:    General: Skin is warm and dry.  Neurological:     Mental Status: She is alert and oriented to person, place, and time.  Psychiatric:        Mood and Affect: Mood normal.        Behavior: Behavior normal.         Assessment & Plan:  1. Lumbar Radiculitis: Continue Lyrica. Continue HEP as Tolerated. Continue to Monitor. 03/23/2023 2. Degeneration of Lumbar : Continue HEP as Tolerated. Continue to Monitor. 03/23/2023 3. Chronic Pain Syndrome: Refilled Tramadol 50 mg take two tablets twice a day as need for pain 120. Continue Lyrica. Continue to monitor.  We will continue the opioid monitoring program, this consists of regular clinic visits, examinations, urine drug screen, pill counts as well as use of [place] Controlled Substance Reporting system. A 12 month History has been reviewed on the [place] Controlled Substance Reporting System on 03/23/2023    F/U in 6 months

## 2023-03-24 ENCOUNTER — Ambulatory Visit: Payer: BC Managed Care – PPO | Admitting: Registered Nurse

## 2023-03-27 LAB — TOXASSURE SELECT,+ANTIDEPR,UR

## 2023-06-03 ENCOUNTER — Other Ambulatory Visit: Payer: Self-pay | Admitting: Physical Medicine & Rehabilitation

## 2023-06-07 ENCOUNTER — Telehealth: Payer: Self-pay | Admitting: *Deleted

## 2023-06-07 NOTE — Telephone Encounter (Signed)
LVM for adjuster to return call Received fax from pharmacy denial of Tramadol 50 mg. Need fax # to fax to?

## 2023-06-23 ENCOUNTER — Encounter: Payer: No Typology Code available for payment source | Attending: Registered Nurse | Admitting: Registered Nurse

## 2023-06-23 DIAGNOSIS — M5137 Other intervertebral disc degeneration, lumbosacral region: Secondary | ICD-10-CM | POA: Insufficient documentation

## 2023-06-23 DIAGNOSIS — Z5181 Encounter for therapeutic drug level monitoring: Secondary | ICD-10-CM | POA: Insufficient documentation

## 2023-06-23 DIAGNOSIS — I10 Essential (primary) hypertension: Secondary | ICD-10-CM | POA: Insufficient documentation

## 2023-06-23 DIAGNOSIS — G894 Chronic pain syndrome: Secondary | ICD-10-CM | POA: Insufficient documentation

## 2023-06-23 DIAGNOSIS — M5416 Radiculopathy, lumbar region: Secondary | ICD-10-CM | POA: Insufficient documentation

## 2023-06-23 DIAGNOSIS — Z79891 Long term (current) use of opiate analgesic: Secondary | ICD-10-CM | POA: Insufficient documentation

## 2023-07-13 NOTE — Progress Notes (Deleted)
Subjective:    Patient ID: Emma Newman, female    DOB: [birth year], 62 y.o.   MRN: [id number]  HPI   Pain Inventory Average Pain {NUMBERS; 0-10:5044} Pain Right Now {NUMBERS; 0-10:5044} My pain is {PAIN DESCRIPTION:21022940}  In the last 24 hours, has pain interfered with the following? General activity {NUMBERS; 0-10:5044} Relation with others {NUMBERS; 0-10:5044} Enjoyment of life {NUMBERS; 0-10:5044} What TIME of day is your pain at its worst? {time of day:24191} Sleep (in general) {BHH GOOD/FAIR/POOR:22877}  Pain is worse with: {ACTIVITIES:21022942} Pain improves with: {PAIN IMPROVES FIEP:32951884} Relief from Meds: {NUMBERS; 0-10:5044}  Family History  Problem Relation Age of Onset   Aneurysm Mother 73       died from complications of AAA   Cancer Mother        breast cancer   Cancer Father 14       mesothelioma   Heart disease Father        cardiomyopathy   Social History   Socioeconomic History   Marital status: Unknown    Spouse name: Not on file   Number of children: Not on file   Years of education: Not on file   Highest education level: Not on file  Occupational History   Not on file  Tobacco Use   Smoking status: Never   Smokeless tobacco: Never  Vaping Use   Vaping status: Never Used  Substance and Sexual Activity   Alcohol use: Yes    Comment: social   Drug use: No   Sexual activity: Not on file  Other Topics Concern   Not on file  Social History Narrative   Not on file   Social Determinants of Health   Financial Resource Strain: Not on file  Food Insecurity: Not on file  Transportation Needs: Not on file  Physical Activity: Not on file  Stress: Not on file  Social Connections: Unknown (02/21/2022)   Received [facility]   Social Network    Social Network: Not on file   Past Surgical History:  Procedure Laterality Date   APPENDECTOMY     1981   DIGIT NAIL REMOVAL Right 06/19/2015   Procedure: Removal right  hallux nail plate;  Surgeon: Toni Arthurs, MD;  Location: [facility];  Service: Orthopedics;  Laterality: Right;   KNEE ARTHROSCOPY Right 2019   ORIF TOE FRACTURE Right 06/19/2015   Procedure: Repair extensor  hallucus longus tendon to distal phalanx;  Surgeon: Toni Arthurs, MD;  Location: [facility];  Service: Orthopedics;  Laterality: Right;   TONSILLECTOMY     Past Surgical History:  Procedure Laterality Date   APPENDECTOMY     1981   DIGIT NAIL REMOVAL Right 06/19/2015   Procedure: Removal right hallux nail plate;  Surgeon: Toni Arthurs, MD;  Location: [facility];  Service: Orthopedics;  Laterality: Right;   KNEE ARTHROSCOPY Right 2019   ORIF TOE FRACTURE Right 06/19/2015   Procedure: Repair extensor  hallucus longus tendon to distal phalanx;  Surgeon: Toni Arthurs, MD;  [facility];  Service: Orthopedics;  Laterality: Right;   TONSILLECTOMY     Past Medical History:  Diagnosis Date   Fibromyalgia    Headache    Herniated lumbar intervertebral disc    L4-S1   Hypertension    LMP 07/05/2012   Opioid Risk Score:   Fall Risk Score:  `1  Depression screen [facility] 2/9     09/24/2022  1:24 PM 08/14/2021   10:53 AM 02/06/2021   11:22 AM 02/02/2019   11:42 AM 08/01/2018    1:07 PM 01/17/2018    2:42 PM 07/15/2017    2:30 PM  Depression screen PHQ 2/9  Decreased Interest 0 0 0 0 0 0 0  Down, Depressed, Hopeless 0 0 0 0 0 0 0  PHQ - 2 Score 0 0 0 0 0 0 0    Review of Systems     Objective:   Physical Exam        Assessment & Plan:

## 2023-07-14 ENCOUNTER — Encounter: Payer: No Typology Code available for payment source | Admitting: Registered Nurse

## 2023-07-15 NOTE — Progress Notes (Deleted)
Subjective:    Patient ID: Emma Newman, female    DOB: [birth year], 62 y.o.   MRN: [id number]  HPI   Pain Inventory Average Pain {NUMBERS; 0-10:5044} Pain Right Now {NUMBERS; 0-10:5044} My pain is {PAIN DESCRIPTION:21022940}  In the last 24 hours, has pain interfered with the following? General activity {NUMBERS; 0-10:5044} Relation with others {NUMBERS; 0-10:5044} Enjoyment of life {NUMBERS; 0-10:5044} What TIME of day is your pain at its worst? {time of day:24191} Sleep (in general) {BHH GOOD/FAIR/POOR:22877}  Pain is worse with: {ACTIVITIES:21022942} Pain improves with: {PAIN IMPROVES FIEP:32951884} Relief from Meds: {NUMBERS; 0-10:5044}  Family History  Problem Relation Age of Onset   Aneurysm Mother 73       died from complications of AAA   Cancer Mother        breast cancer   Cancer Father 14       mesothelioma   Heart disease Father        cardiomyopathy   Social History   Socioeconomic History   Marital status: Unknown    Spouse name: Not on file   Number of children: Not on file   Years of education: Not on file   Highest education level: Not on file  Occupational History   Not on file  Tobacco Use   Smoking status: Never   Smokeless tobacco: Never  Vaping Use   Vaping status: Never Used  Substance and Sexual Activity   Alcohol use: Yes    Comment: social   Drug use: No   Sexual activity: Not on file  Other Topics Concern   Not on file  Social History Narrative   Not on file   Social Determinants of Health   Financial Resource Strain: Not on file  Food Insecurity: Not on file  Transportation Needs: Not on file  Physical Activity: Not on file  Stress: Not on file  Social Connections: Unknown (02/21/2022)   Received [facility]   Social Network    Social Network: Not on file   Past Surgical History:  Procedure Laterality Date   APPENDECTOMY     1981   DIGIT NAIL REMOVAL Right 06/19/2015   Procedure: Removal right  hallux nail plate;  Surgeon: Toni Arthurs, MD;  Location: [facility];  Service: Orthopedics;  Laterality: Right;   KNEE ARTHROSCOPY Right 2019   ORIF TOE FRACTURE Right 06/19/2015   Procedure: Repair extensor  hallucus longus tendon to distal phalanx;  Surgeon: Toni Arthurs, MD;  Location: [facility];  Service: Orthopedics;  Laterality: Right;   TONSILLECTOMY     Past Surgical History:  Procedure Laterality Date   APPENDECTOMY     1981   DIGIT NAIL REMOVAL Right 06/19/2015   Procedure: Removal right hallux nail plate;  Surgeon: Toni Arthurs, MD;  Location: [facility];  Service: Orthopedics;  Laterality: Right;   KNEE ARTHROSCOPY Right 2019   ORIF TOE FRACTURE Right 06/19/2015   Procedure: Repair extensor  hallucus longus tendon to distal phalanx;  Surgeon: Toni Arthurs, MD;  [facility];  Service: Orthopedics;  Laterality: Right;   TONSILLECTOMY     Past Medical History:  Diagnosis Date   Fibromyalgia    Headache    Herniated lumbar intervertebral disc    L4-S1   Hypertension    LMP 07/05/2012   Opioid Risk Score:   Fall Risk Score:  `1  Depression screen [facility] 2/9     09/24/2022  1:24 PM 08/14/2021   10:53 AM 02/06/2021   11:22 AM 02/02/2019   11:42 AM 08/01/2018    1:07 PM 01/17/2018    2:42 PM 07/15/2017    2:30 PM  Depression screen PHQ 2/9  Decreased Interest 0 0 0 0 0 0 0  Down, Depressed, Hopeless 0 0 0 0 0 0 0  PHQ - 2 Score 0 0 0 0 0 0 0    Review of Systems     Objective:   Physical Exam        Assessment & Plan:

## 2023-07-18 ENCOUNTER — Ambulatory Visit: Payer: No Typology Code available for payment source | Admitting: Registered Nurse

## 2023-08-16 ENCOUNTER — Encounter: Payer: Self-pay | Admitting: Registered Nurse

## 2023-08-16 ENCOUNTER — Encounter: Payer: No Typology Code available for payment source | Attending: Registered Nurse | Admitting: Registered Nurse

## 2023-08-16 VITALS — BP 149/84 | HR 68 | Ht 69.5 in | Wt 180.0 lb

## 2023-08-16 DIAGNOSIS — M5416 Radiculopathy, lumbar region: Secondary | ICD-10-CM | POA: Diagnosis not present

## 2023-08-16 DIAGNOSIS — G894 Chronic pain syndrome: Secondary | ICD-10-CM | POA: Diagnosis present

## 2023-08-16 DIAGNOSIS — Z5181 Encounter for therapeutic drug level monitoring: Secondary | ICD-10-CM | POA: Insufficient documentation

## 2023-08-16 DIAGNOSIS — Z79891 Long term (current) use of opiate analgesic: Secondary | ICD-10-CM | POA: Insufficient documentation

## 2023-08-16 NOTE — Progress Notes (Unsigned)
Subjective:    Patient ID: Emma Newman, female    DOB: Apr 02, 1961, 62 y.o.   MRN: [id number]  HPI: Emma Newman is a 62 y.o. female who returns for follow up appointment for chronic pain and medication refill. states *** pain is located in  ***. rates pain ***. current exercise regime is walking and performing stretching exercises.  Emma Newman Morphine equivalent is 40.00 MME.   Last UDS was Performed on 03/23/2023, it was consistent.     Pain Inventory Average Pain 4 Pain Right Now 3 My pain is sharp, burning, and aching  In the last 24 hours, has pain interfered with the following? General activity 4 Relation with others 3 Enjoyment of life 4 What TIME of day is your pain at its worst? daytime, evening, and night Sleep (in general) Fair  Pain is worse with: bending, sitting, inactivity, and some activites Pain improves with: rest, heat/ice, therapy/exercise, pacing activities, and medication Relief from Meds: 5  Family History  Problem Relation Age of Onset   Aneurysm Mother 43       died from complications of AAA   Cancer Mother        breast cancer   Cancer Father 55       mesothelioma   Heart disease Father        cardiomyopathy   Social History   Socioeconomic History   Marital status: Unknown    Spouse name: Not on file   Number of children: Not on file   Years of education: Not on file   Highest education level: Not on file  Occupational History   Not on file  Tobacco Use   Smoking status: Never   Smokeless tobacco: Never  Vaping Use   Vaping status: Never Used  Substance and Sexual Activity   Alcohol use: Yes    Comment: social   Drug use: No   Sexual activity: Not on file  Other Topics Concern   Not on file  Social History Narrative   Not on file   Social Determinants of Health   Financial Resource Strain: Not on file  Food Insecurity: Not on file  Transportation Needs: Not on file  Physical Activity: Not on file  Stress: Not on file   Social Connections: Unknown (02/21/2022)   Received from [facility], [facility]   Social Network    Social Network: Not on file   Past Surgical History:  Procedure Laterality Date   APPENDECTOMY     1981   DIGIT NAIL REMOVAL Right 06/19/2015   Procedure: Removal right hallux nail plate;  Surgeon: Toni Arthurs, MD;  Location: [facility];  Service: Orthopedics;  Laterality: Right;   KNEE ARTHROSCOPY Right 2019   ORIF TOE FRACTURE Right 06/19/2015   Procedure: Repair extensor  hallucus longus tendon to distal phalanx;  Surgeon: Toni Arthurs, MD;  Location: [facility];  Service: Orthopedics;  Laterality: Right;   TONSILLECTOMY     Past Surgical History:  Procedure Laterality Date   APPENDECTOMY     1981   DIGIT NAIL REMOVAL Right 06/19/2015   Procedure: Removal right hallux nail plate;  Surgeon: Toni Arthurs, MD;  Location: [facility];  Service: Orthopedics;  Laterality: Right;   KNEE ARTHROSCOPY Right 2019   ORIF TOE FRACTURE Right 06/19/2015   Procedure: Repair extensor  hallucus longus tendon to distal phalanx;  Surgeon: Toni Arthurs, MD;  Location: [facility];  Service: Orthopedics;  Laterality: Right;   TONSILLECTOMY     Past Medical History:  Diagnosis Date   Fibromyalgia    Headache    Herniated lumbar intervertebral disc    L4-S1   Hypertension    LMP 07/05/2012   Opioid Risk Score:   Fall Risk Score:  `1  Depression screen PHQ 2/9     09/24/2022    1:24 PM 08/14/2021   10:53 AM 02/06/2021   11:22 AM 02/02/2019   11:42 AM 08/01/2018    1:07 PM 01/17/2018    2:42 PM 07/15/2017    2:30 PM  Depression screen PHQ 2/9  Decreased Interest 0 0 0 0 0 0 0  Down, Depressed, Hopeless 0 0 0 0 0 0 0  PHQ - 2 Score 0 0 0 0 0 0 0     Review of Systems  Musculoskeletal:  Positive for back pain and gait problem.  All other systems reviewed and are negative.     Objective:   Physical Exam        Assessment & Plan:   1. Lumbar Radiculitis: Continue Lyrica. Continue HEP as Tolerated. Continue to Monitor. 03/23/2023 2. Degeneration of Lumbar : Continue HEP as Tolerated. Continue to Monitor. 03/23/2023 3. Chronic Pain Syndrome: Refilled Tramadol 50 mg take two tablets twice a day as need for pain 120. Continue Lyrica. Continue to monitor.  We will continue the opioid monitoring program, this consists of regular clinic visits, examinations, urine drug screen, pill counts as well as use of [place] Controlled Substance Reporting system. A 12 month History has been reviewed on the [place] Controlled Substance Reporting System on 03/23/2023    F/U in 6 months

## 2023-10-07 ENCOUNTER — Other Ambulatory Visit: Payer: Self-pay | Admitting: Registered Nurse

## 2023-10-07 ENCOUNTER — Telehealth: Payer: Self-pay | Admitting: Registered Nurse

## 2023-10-07 MED ORDER — TRAMADOL HCL 50 MG PO TABS: 100.0000 mg | ORAL_TABLET | Freq: Two times a day (BID) | ORAL | 5 refills | Status: DC

## 2023-10-07 NOTE — Telephone Encounter (Signed)
PMP was Reviewed. Tramadol was e-scribed to pharmacy.  My-Chart message was sent to Ms. Wurster regarding the above.

## 2023-10-25 ENCOUNTER — Other Ambulatory Visit: Payer: Self-pay | Admitting: Nurse Practitioner

## 2023-10-25 DIAGNOSIS — M278 Other specified diseases of jaws: Secondary | ICD-10-CM

## 2023-11-28 ENCOUNTER — Other Ambulatory Visit: Payer: Self-pay | Admitting: Nurse Practitioner

## 2023-11-28 DIAGNOSIS — Z780 Asymptomatic menopausal state: Secondary | ICD-10-CM

## 2023-11-28 DIAGNOSIS — E2839 Other primary ovarian failure: Secondary | ICD-10-CM

## 2023-12-05 ENCOUNTER — Other Ambulatory Visit: Payer: Self-pay | Admitting: Physical Medicine & Rehabilitation

## 2024-02-08 ENCOUNTER — Encounter: Attending: Registered Nurse | Admitting: Registered Nurse

## 2024-02-08 ENCOUNTER — Encounter: Payer: Self-pay | Admitting: Registered Nurse

## 2024-02-08 VITALS — BP 152/83 | HR 72 | Ht 69.5 in | Wt 189.4 lb

## 2024-02-08 DIAGNOSIS — Z5181 Encounter for therapeutic drug level monitoring: Secondary | ICD-10-CM | POA: Insufficient documentation

## 2024-02-08 DIAGNOSIS — I10 Essential (primary) hypertension: Secondary | ICD-10-CM | POA: Diagnosis present

## 2024-02-08 DIAGNOSIS — G894 Chronic pain syndrome: Secondary | ICD-10-CM | POA: Diagnosis present

## 2024-02-08 DIAGNOSIS — Z79891 Long term (current) use of opiate analgesic: Secondary | ICD-10-CM | POA: Insufficient documentation

## 2024-02-08 DIAGNOSIS — M5416 Radiculopathy, lumbar region: Secondary | ICD-10-CM | POA: Diagnosis present

## 2024-02-08 MED ORDER — TRAMADOL HCL 50 MG PO TABS: 100.0000 mg | ORAL_TABLET | Freq: Two times a day (BID) | ORAL | 5 refills | Status: DC

## 2024-02-08 MED ORDER — PREGABALIN 150 MG PO CAPS: 150.0000 mg | ORAL_CAPSULE | Freq: Two times a day (BID) | ORAL | 1 refills | Status: DC

## 2024-02-08 NOTE — Progress Notes (Signed)
 Subjective:    Patient ID: Emma Newman, female    DOB: 11/09/1960, 63 y.o.   MRN: [id number]  HPI: Emma Newman is a 63 y.o. female who returns for follow up appointment for chronic pain and medication refill. She states her pain is located in her lower back radiating into her right lower extremity. Also reports right foot numbness. She rates her pain 3. Her current exercise regime is walking and performing stretching exercises.  Emma Newman has Esssential Hypertension, she reports she has been out of her ant-hypertensive medication for a week. She has called her PCP office several times an dpharmacy. Her pharmacy was called, they have her prescription. She will pick up her medication today, she verbalizes understanding.   Emma Newman Morphine equivalent is 40.00 MME.   Oral Swab was Performed today.     Pain Inventory Average Pain 5 Pain Right Now 3 My pain is sharp, dull, tingling, and aching  In the last 24 hours, has pain interfered with the following? General activity 4 Relation with others 2 Enjoyment of life 4 What TIME of day is your pain at its worst? evening and night Sleep (in general) Poor  Pain is worse with: bending, standing, and some activites Pain improves with: rest, pacing activities, and medication Relief from Meds: 3  Family History  Problem Relation Age of Onset   Aneurysm Mother 33       died from complications of AAA   Cancer Mother        breast cancer   Cancer Father 37       mesothelioma   Heart disease Father        cardiomyopathy   Social History   Socioeconomic History   Marital status: Unknown    Spouse name: Not on file   Number of children: Not on file   Years of education: Not on file   Highest education level: Not on file  Occupational History   Not on file  Tobacco Use   Smoking status: Never   Smokeless tobacco: Never  Vaping Use   Vaping status: Never Used  Substance and Sexual Activity   Alcohol use: Yes    Comment: social    Drug use: No   Sexual activity: Not on file  Other Topics Concern   Not on file  Social History Narrative   Not on file   Social Drivers of Health   Financial Resource Strain: Not on file  Food Insecurity: Not on file  Transportation Needs: Not on file  Physical Activity: Not on file  Stress: Not on file  Social Connections: Unknown (02/21/2022)   Received from [facility], [facility]   Social Network    Social Network: Not on file   Past Surgical History:  Procedure Laterality Date   APPENDECTOMY     1981   DIGIT NAIL REMOVAL Right 06/19/2015   Procedure: Removal right hallux nail plate;  Surgeon: Amada Backer, MD;  Location: [facility];  Service: Orthopedics;  Laterality: Right;   KNEE ARTHROSCOPY Right 2019   ORIF TOE FRACTURE Right 06/19/2015   Procedure: Repair extensor  hallucus longus tendon to distal phalanx;  Surgeon: Amada Backer, MD;  Location: [facility];  Service: Orthopedics;  Laterality: Right;   TONSILLECTOMY     Past Surgical History:  Procedure Laterality Date   APPENDECTOMY     1981   DIGIT NAIL REMOVAL Right 06/19/2015   Procedure: Removal right hallux nail plate;  Surgeon: Amada Backer, MD;  Location: [facility];  Service: Orthopedics;  Laterality: Right;   KNEE ARTHROSCOPY Right 2019   ORIF TOE FRACTURE Right 06/19/2015   Procedure: Repair extensor  hallucus longus tendon to distal phalanx;  Surgeon: Amada Backer, MD;  Location: [facility];  Service: Orthopedics;  Laterality: Right;   TONSILLECTOMY     Past Medical History:  Diagnosis Date   Fibromyalgia    Headache    Herniated lumbar intervertebral disc    L4-S1   Hypertension    LMP 07/05/2012   Opioid Risk Score:   Fall Risk Score:  `1  Depression screen PHQ 2/9     08/16/2023    3:25 PM 09/24/2022    1:24 PM 08/14/2021   10:53 AM 02/06/2021   11:22 AM 02/02/2019   11:42 AM 08/01/2018    1:07 PM 01/17/2018    2:42 PM  Depression  screen PHQ 2/9  Decreased Interest 0 0 0 0 0 0 0  Down, Depressed, Hopeless 0 0 0 0 0 0 0  PHQ - 2 Score 0 0 0 0 0 0 0     Review of Systems  Musculoskeletal:  Positive for back pain.       Right foot pain  All other systems reviewed and are negative.      Objective:   Physical Exam Vitals and nursing note reviewed.  Constitutional:      Appearance: Normal appearance.  Cardiovascular:     Rate and Rhythm: Normal rate and regular rhythm.     Pulses: Normal pulses.     Heart sounds: Normal heart sounds.  Pulmonary:     Effort: Pulmonary effort is normal.     Breath sounds: Normal breath sounds.  Musculoskeletal:     Comments: Normal Muscle Bulk and Muscle Testing Reveals:  Upper Extremities: Full ROM and Muscle Strength 5/5  Lumbar Paraspinal Tenderness: L-4-L-5 Lower Extremities: Full ROM and Muscle Strength 5/5 Arises from Table with Ease Narrow Based   Gait     Skin:    General: Skin is warm and dry.  Neurological:     Mental Status: She is alert and oriented to person, place, and time.  Psychiatric:        Mood and Affect: Mood normal.        Behavior: Behavior normal.           Assessment & Plan:  1. Lumbar Radiculitis: Continue Lyrica . Pregabalin  150 mg twice a day/ #180. Continue HEP as Tolerated. Continue to Monitor. 02/08/2024 2. Degeneration of Lumbar : Continue HEP as Tolerated. Continue to Monitor. 02/08/2024 3. Chronic Pain Syndrome: Refilled Tramadol  50 mg take two tablets twice a day as need for pain 120. Continue Lyrica . Continue to monitor.  We will continue the opioid monitoring program, this consists of regular clinic visits, examinations, urine drug screen, pill counts as well as use of [place]  Controlled Substance Reporting system. A 12 month History has been reviewed on the [place]  Controlled Substance Reporting System on 02/08/2024  4. Essential Hypertension: Blood Pressure was re-checked. See HPI or details. Continue to monitor.  She will pick up her anti-hypertensive medication today. She verbalizes understanding.    F/U in 6 months

## 2024-02-09 ENCOUNTER — Encounter: Payer: BC Managed Care – PPO | Admitting: Registered Nurse

## 2024-02-12 LAB — DRUG TOX MONITOR 1 W/CONF, ORAL FLD

## 2024-02-12 LAB — DRUG TOX ALC METAB W/CON, ORAL FLD: Alcohol Metabolite: NEGATIVE ng/mL (ref <25)

## 2024-04-18 ENCOUNTER — Telehealth: Payer: Self-pay

## 2024-04-18 NOTE — Telephone Encounter (Signed)
 PA submitted for Tramadol  50 mg tablets.  Approval valid from 04/18/2024-10/19/2024.  Copy sent to be scanned in chart under media tab   PA: [id number]

## 2024-04-18 NOTE — Telephone Encounter (Signed)
 PA initiated on Cover My Meds Key: [id number] for Tramadol , notes and diagnosis codes sent to plan

## 2024-04-18 NOTE — Telephone Encounter (Signed)
 Approval rec'd from insurance for Tramadol  50 mg. Copy of approval will be sent to scan in chart.  Approval valid from 04/18/24 - 10/19/24. PA [id number]

## 2024-07-05 ENCOUNTER — Other Ambulatory Visit: Payer: Self-pay | Admitting: Physical Medicine & Rehabilitation

## 2024-08-03 NOTE — Progress Notes (Signed)
 Subjective:    Patient ID: Emma Newman, female    DOB: 03-27-1961, 63 y.o.   MRN: [id number]  HPI: Emma Newman is a 63 y.o. female who returns for follow up appointment for chronic pain and medication refill. She  states her pain is located in her lower back radiating into her buttocks. She rates her pain 4. Her current exercise regime is walking and performing stretching exercises.  Ms. Decelles Morphine equivalent is 40.00 MME.   Last Oral Swab was Performed on 02/08/2024, it was consistent.      Pain Inventory Average Pain 5 Pain Right Now 4 My pain is burning, dull, and aching  In the last 24 hours, has pain interfered with the following? General activity 4 Relation with others 4 Enjoyment of life 6 What TIME of day is your pain at its worst? daytime and evening Sleep (in general) Poor  Pain is worse with: standing and some activites Pain improves with: rest and pacing activities Relief from Meds: 4  Family History  Problem Relation Age of Onset   Aneurysm Mother 79       died from complications of AAA   Cancer Mother        breast cancer   Cancer Father 49       mesothelioma   Heart disease Father        cardiomyopathy   Social History   Socioeconomic History   Marital status: Unknown    Spouse name: Not on file   Number of children: Not on file   Years of education: Not on file   Highest education level: Not on file  Occupational History   Not on file  Tobacco Use   Smoking status: Never   Smokeless tobacco: Never  Vaping Use   Vaping status: Never Used  Substance and Sexual Activity   Alcohol use: Yes    Comment: social   Drug use: No   Sexual activity: Not on file  Other Topics Concern   Not on file  Social History Narrative   Not on file   Social Drivers of Health   Financial Resource Strain: Not on file  Food Insecurity: Not on file  Transportation Needs: Not on file  Physical Activity: Not on file  Stress: Not on file  Social  Connections: Unknown (02/21/2022)   Received from [facility]   Social Network    Social Network: Not on file   Past Surgical History:  Procedure Laterality Date   APPENDECTOMY     1981   DIGIT NAIL REMOVAL Right 06/19/2015   Procedure: Removal right hallux nail plate;  Surgeon: Norleen Armor, MD;  Location: [facility];  Service: Orthopedics;  Laterality: Right;   KNEE ARTHROSCOPY Right 2019   ORIF TOE FRACTURE Right 06/19/2015   Procedure: Repair extensor  hallucus longus tendon to distal phalanx;  Surgeon: Norleen Armor, MD;  Location: [facility];  Service: Orthopedics;  Laterality: Right;   TONSILLECTOMY     Past Surgical History:  Procedure Laterality Date   APPENDECTOMY     1981   DIGIT NAIL REMOVAL Right 06/19/2015   Procedure: Removal right hallux nail plate;  Surgeon: Norleen Armor, MD;  Location: [facility];  Service: Orthopedics;  Laterality: Right;   KNEE ARTHROSCOPY Right 2019   ORIF TOE FRACTURE Right 06/19/2015   Procedure: Repair extensor  hallucus longus tendon to distal phalanx;  Surgeon: Norleen Armor, MD;  Location: [facility];  Service: Orthopedics;  Laterality: Right;   TONSILLECTOMY     Past Medical History:  Diagnosis Date   Fibromyalgia    Headache    Herniated lumbar intervertebral disc    L4-S1   Hypertension    LMP 07/05/2012   Opioid Risk Score:   Fall Risk Score:  `1  Depression screen [facility] 2/9     02/08/2024    3:07 PM 08/16/2023    3:25 PM 09/24/2022    1:24 PM 08/14/2021   10:53 AM 02/06/2021   11:22 AM 02/02/2019   11:42 AM 08/01/2018    1:07 PM  Depression screen PHQ 2/9  Decreased Interest 0 0 0 0 0 0 0  Down, Depressed, Hopeless 0 0 0 0 0 0 0  PHQ - 2 Score 0 0 0 0 0 0 0    Review of Systems     Objective:   Physical Exam Vitals and nursing note reviewed.  Constitutional:      Appearance: Normal appearance.  Cardiovascular:     Rate and Rhythm: Normal rate and regular rhythm.      Pulses: Normal pulses.     Heart sounds: Normal heart sounds.  Pulmonary:     Effort: Pulmonary effort is normal.     Breath sounds: Normal breath sounds.  Musculoskeletal:     Comments: Normal Muscle Bulk and Muscle Testing Reveals:  Upper Extremities: Full ROM and Muscle Strength 5/5 Lumbar Paraspinal Tenderness: L-4-L-5 Lower Extremities: Full ROM and Muscle Strength 5/5 Arises from Table with ease Narrow Based Gait     Skin:    General: Skin is warm and dry.  Neurological:     Mental Status: She is alert and oriented to person, place, and time.  Psychiatric:        Mood and Affect: Mood normal.        Behavior: Behavior normal.          Assessment & Plan:  1. Lumbar Radiculitis: Continue Lyrica . Pregabalin  150 mg twice a day/ #180. Continue HEP as Tolerated. Continue to Monitor. 08/06/2024 2. Degeneration of Lumbar : Continue HEP as Tolerated. Continue to Monitor. 08/06/2024 3. Chronic Pain Syndrome: Refilled Tramadol  50 mg take two tablets twice a day as need for pain 120. Continue Lyrica . Continue to monitor.  We will continue the opioid monitoring program, this consists of regular clinic visits, examinations, urine drug screen, pill counts as well as use of [place]  Controlled Substance Reporting system. A 12 month History has been reviewed on the [place]  Controlled Substance Reporting System on 08/06/2024   F/U in 6 months

## 2024-08-06 ENCOUNTER — Encounter: Payer: Self-pay | Admitting: Registered Nurse

## 2024-08-06 ENCOUNTER — Encounter: Attending: Registered Nurse | Admitting: Registered Nurse

## 2024-08-06 VITALS — BP 130/81 | HR 93 | Ht 68.0 in | Wt 206.2 lb

## 2024-08-06 DIAGNOSIS — Z79891 Long term (current) use of opiate analgesic: Secondary | ICD-10-CM | POA: Insufficient documentation

## 2024-08-06 DIAGNOSIS — M5416 Radiculopathy, lumbar region: Secondary | ICD-10-CM | POA: Diagnosis not present

## 2024-08-06 DIAGNOSIS — G894 Chronic pain syndrome: Secondary | ICD-10-CM | POA: Insufficient documentation

## 2024-08-06 DIAGNOSIS — Z5181 Encounter for therapeutic drug level monitoring: Secondary | ICD-10-CM | POA: Insufficient documentation

## 2024-08-06 MED ORDER — TRAMADOL HCL 50 MG PO TABS: 100.0000 mg | ORAL_TABLET | Freq: Two times a day (BID) | ORAL | 5 refills | Status: AC

## 2024-10-24 NOTE — Progress Notes (Signed)
 " Cardiology Office Note:    Date:  10/25/2024   ID:  Emma Newman, DOB [birth year], MRN [id number]  PCP:  Stephanie Charlene CROME, MD  Cardiologist:  Lonni CROME Nanas, MD  Electrophysiologist:  None   Referring MD: Orlando Dwayne KATHEE, FNP   Chief Complaint  Patient presents with   Shortness of Breath         History of Present Illness:    Emma Newman is a 64 y.o. female with a hx of fibromyalgia, hypertension, hyperlipidemia who is referred by Dwayne Orlando, NP for evaluation of heart murmur.  She denies any chest pain, lightheadedness, or syncope.  Does report having dyspnea on exertion.  States that when she walks to work she will feel shortness of breath.  Also reports having some swelling in her ankles.  She reports BP typically 120s over 70s.  She smoked socially for 2 years, quit in her 63s.  Family history includes father had some type of cardiomyopathy (unclear of details) and patient underwent screening echocardiogram in 1980s and was told she did not have what her father had.  Brother had arrhythmia, she is unsure of details, reports he underwent ablation.   Past Medical History:  Diagnosis Date   Fibromyalgia    Headache    Herniated lumbar intervertebral disc    L4-S1   Hypertension     Past Surgical History:  Procedure Laterality Date   APPENDECTOMY     1981   DIGIT NAIL REMOVAL Right 06/19/2015   Procedure: Removal right hallux nail plate;  Surgeon: Norleen Armor, MD;  Location: [facility];  Service: Orthopedics;  Laterality: Right;   KNEE ARTHROSCOPY Right 2019   ORIF TOE FRACTURE Right 06/19/2015   Procedure: Repair extensor  hallucus longus tendon to distal phalanx;  Surgeon: Norleen Armor, MD;  Location: [facility];  Service: Orthopedics;  Laterality: Right;   TONSILLECTOMY      Current Medications: Active Medications[1]   Allergies:   Sulfa antibiotics   Social History   Socioeconomic History   Marital status: Unknown     Spouse name: Not on file   Number of children: Not on file   Years of education: Not on file   Highest education level: Not on file  Occupational History   Not on file  Tobacco Use   Smoking status: Never   Smokeless tobacco: Never  Vaping Use   Vaping status: Never Used  Substance and Sexual Activity   Alcohol use: Yes    Comment: social   Drug use: No   Sexual activity: Not on file  Other Topics Concern   Not on file  Social History Narrative   Not on file   Social Drivers of Health   Tobacco Use: Low Risk (10/25/2024)   Patient History    Smoking Tobacco Use: Never    Smokeless Tobacco Use: Never    Passive Exposure: Not on file  Financial Resource Strain: Not on file  Food Insecurity: Not on file  Transportation Needs: Not on file  Physical Activity: Not on file  Stress: Not on file  Social Connections: Not on file  Depression (PHQ2-9): Low Risk (02/08/2024)   Depression (PHQ2-9)    PHQ-2 Score: 0  Alcohol Screen: Not on file  Housing: Not on file  Utilities: Not on file  Health Literacy: Not on file     Family History: The patient's family history includes Aneurysm (age of onset: 4) in her mother; Cancer in  her mother; Cancer (age of onset: 58) in her father; Heart disease in her father.  ROS:   Please see the history of present illness.     All other systems reviewed and are negative.  EKGs/Labs/Other Studies Reviewed:    The following studies were reviewed today:   EKG:   10/25/2024: Normal sinus rhythm, rate 77, poor R wave progression  Recent Labs: No results found for requested labs within last 365 days.  Recent Lipid Panel No results found for: CHOL, TRIG, HDL, CHOLHDL, VLDL, LDLCALC, LDLDIRECT  Physical Exam:    VS:  BP (!) 140/76 (BP Location: Left Arm, Patient Position: Sitting, Cuff Size: Large)   Pulse 77   Ht 5' 8 (1.727 m)   Wt 207 lb (93.9 kg)   LMP 07/05/2012   SpO2 97%   BMI 31.47 kg/m     Wt Readings from  Last 3 Encounters:  10/25/24 207 lb (93.9 kg)  08/06/24 206 lb 3.2 oz (93.5 kg)  02/08/24 189 lb 6.4 oz (85.9 kg)     GEN:  Well nourished, well developed in no acute distress HEENT: Normal NECK: No JVD; No carotid bruits LYMPHATICS: No lymphadenopathy CARDIAC: RRR, Genasec systolic murmur RESPIRATORY:  Clear to auscultation without rales, wheezing or rhonchi  ABDOMEN: Soft, non-tender, non-distended MUSCULOSKELETAL:  No edema; No deformity  SKIN: Warm and dry NEUROLOGIC:  Alert and oriented x 3 PSYCHIATRIC:  Normal affect   ASSESSMENT:    1. DOE (dyspnea on exertion)   2. Heart murmur   3. Essential hypertension   4. Encounter to establish care   5. Pre-procedure lab exam   6. Hyperlipidemia, unspecified hyperlipidemia type    PLAN:    DOE: Concerning for anginal equivalent, does have multiple CAD risk factors (age, hypertension, hyperlipidemia).  Recommend coronary CTA to evaluate for obstructive CAD.  Will give Lopressor  100 mg prior to study.  Heart murmur: 2/6 systolic murmur, will evaluate with echocardiogram  Hypertension: On lisinopril 5 mg daily and minoxidil 2.5 mg daily (takes for hair loss).  BP elevated in clinic today but reports has typically been under good control.  Asked to check BP twice daily for next week  Hyperlipidemia: On rosuvastatin 5 mg daily.  LDL 166 on 02/2024, started statin at that time.  Will update lipid panel  RTC in 3 months   Medication Adjustments/Labs and Tests Ordered: Current medicines are reviewed at length with the patient today.  Concerns regarding medicines are outlined above.  Orders Placed This Encounter  Procedures   CT CORONARY MORPH W/CTA COR W/SCORE W/CA W/CM &/OR WO/CM   Basic Metabolic Panel (BMET)   Lipid panel   EKG 12-Lead   ECHOCARDIOGRAM COMPLETE   Meds ordered this encounter  Medications   metoprolol  tartrate (LOPRESSOR ) 100 MG tablet    Sig: Take 1 tablet (100 mg total) by mouth 2 hour prior to CT.     Dispense:  1 tablet    Refill:  0   Blood Pressure Monitoring (BLOOD PRESSURE MONITOR AUTOMAT) DEVI    Sig: Use as directed to monitor blood pressure.    Dispense:  1 each    Refill:  0    Patient Instructions  Medication Instructions:  Your physician recommends that you continue on your current medications as directed. Please refer to the Current Medication list given to you today.  *If you need a refill on your cardiac medications before your next appointment, please call your pharmacy* Please take your blood pressure twice  daily for 1 week and send in a MyChart message. Please include heart rates. (One message at the end of the week).   HOW TO TAKE YOUR BLOOD PRESSURE: Rest 5 minutes before taking your blood pressure. Dont smoke or drink caffeinated beverages for at least 30 minutes before. Take your blood pressure before (not after) you eat. Sit comfortably with your back supported and both feet on the floor (dont cross your legs). Elevate your arm to heart level on a table or a desk. Use the proper sized cuff. It should fit smoothly and snugly around your bare upper arm. There should be enough room to slip a fingertip under the cuff. The bottom edge of the cuff should be 1 inch above the crease of the elbow. Ideally, take 3 measurements at one sitting and record the average.  Blood Pressure Log Date/Time Medications taken? (Y/N) Blood Pressure Heart Rate/Pulse                                                                                                                                                                                          Lab Work: BMET, Lipids If you have labs (blood work) drawn today and your tests are completely normal, you will receive your results only by: [employer] (if you have MyChart) OR A paper copy in the mail If you have any lab test that is abnormal or we need to change your treatment, we will call you to review the  results.  Testing/Procedures: Your physician has requested that you have an echocardiogram. Echocardiography is a painless test that uses sound waves to create images of your heart. It provides your doctor with information about the size and shape of your heart and how well your hearts chambers and valves are working. This procedure takes approximately one hour. There are no restrictions for this procedure. Please do NOT wear cologne, perfume, aftershave, or lotions (deodorant is allowed). Please arrive 15 minutes prior to your appointment time.  Please note: We ask at that you not bring children with you during ultrasound (echo/ vascular) testing. Due to room size and safety concerns, children are not allowed in the ultrasound rooms during exams. Our front office staff cannot provide observation of children in our lobby area while testing is being conducted. An adult accompanying a patient to their appointment will only be allowed in the ultrasound room at the discretion of the ultrasound technician under special circumstances. We apologize for any inconvenience.    Your cardiac CT will be scheduled at one of the below locations:   [facility] [address]  [place], [place]  [address]  If scheduled at the [facility] at [address], please enter the parking lot using the [address] entrance and use the FREE valet service at the patient drop-off area. Enter the building and check-in with registration on the main floor.  Please follow these instructions carefully (unless otherwise directed):  An IV will be required for this test and Nitroglycerin will be given.   On the Night Before the Test: Be sure to Drink plenty of water. Do not consume any caffeinated/decaffeinated beverages or chocolate 12 hours prior to your test. Do not take any antihistamines 12 hours prior to your test.  On the Day of the Test: Drink plenty of water until 1  hour prior to the test. Do not eat any food 1 hour prior to test. You may take your regular medications prior to the test.  Take metoprolol  (Lopressor ) two hours prior to test. If you take Furosemide/Hydrochlorothiazide/Spironolactone/Chlorthalidone, please HOLD on the morning of the test. Patients who wear a continuous glucose monitor MUST remove the device prior to scanning. FEMALES- please wear underwire-free bra if available, avoid dresses & tight clothing      After the Test: Drink plenty of water. After receiving IV contrast, you may experience a mild flushed feeling. This is normal. On occasion, you may experience a mild rash up to 24 hours after the test. This is not dangerous. If this occurs, you can take Benadryl 25 mg, Zyrtec, Claritin, or Allegra and increase your fluid intake. (Patients taking Tikosyn should avoid Benadryl, and may take Zyrtec, Claritin, or Allegra) If you experience trouble breathing, this can be serious. If it is severe call 911 IMMEDIATELY. If it is mild, please call our office.  We will call to schedule your test 2-4 weeks out understanding that some insurance companies will need an authorization prior to the service being performed.   For more information and frequently asked questions, please visit our website : [email or web address]  For non-scheduling related questions, please contact the cardiac imaging nurse navigator should you have any questions/concerns: Cardiac Imaging Nurse Navigators Direct Office Dial: [phone]   For scheduling needs, including cancellations and rescheduling, please call Brittany, [phone].  Follow-Up: At [facility], you and your health needs are our priority.  As part of our continuing mission to provide you with exceptional heart care, our providers are all part of one team.  This team includes your primary Cardiologist (physician) and Advanced Practice Providers or APPs (Physician Assistants and  Nurse Practitioners) who all work together to provide you with the care you need, when you need it.  Your next appointment:   3 month(s)  Provider:   Lonni LITTIE Nanas, MD     Other Instructions:            Signed, Lonni LITTIE Nanas, MD  10/25/2024 4:51 PM    [facility]     [1]  Current Meds  Medication Sig   augmented betamethasone dipropionate (DIPROLENE-AF) 0.05 % cream Apply 1 Application topically 2 (two) times daily.   Blood Pressure Monitoring (BLOOD PRESSURE MONITOR AUTOMAT) DEVI Use as directed to monitor blood pressure.   ibuprofen (ADVIL,MOTRIN) 200 MG tablet Take 200 mg by mouth daily.   levothyroxine (SYNTHROID) 25 MCG tablet Take 25 mcg by mouth every morning.   lisinopril (ZESTRIL) 5 MG tablet Take 5 mg by mouth daily.   metoprolol  tartrate (LOPRESSOR ) 100 MG tablet Take 1 tablet (100 mg total) by mouth 2 hour prior to  CT.   minoxidil (LONITEN) 2.5 MG tablet Take 2.5 mg by mouth daily.   naproxen (NAPROSYN) 500 MG tablet naproxen 500 mg tablet  TAKE 1 TABLET BY MOUTH TWICE A DAY   pregabalin  (LYRICA ) 150 MG capsule TAKE 1 CAPSULE BY MOUTH TWICE A DAY   rosuvastatin (CRESTOR) 5 MG tablet Take 5 mg by mouth daily.   traMADol  (ULTRAM ) 50 MG tablet Take 2 tablets (100 mg total) by mouth 2 (two) times daily.   "

## 2024-10-25 ENCOUNTER — Ambulatory Visit: Attending: Cardiology | Admitting: Cardiology

## 2024-10-25 ENCOUNTER — Encounter: Payer: Self-pay | Admitting: Cardiology

## 2024-10-25 ENCOUNTER — Other Ambulatory Visit (HOSPITAL_COMMUNITY): Payer: Self-pay

## 2024-10-25 VITALS — BP 140/76 | HR 77 | Ht 68.0 in | Wt 207.0 lb

## 2024-10-25 DIAGNOSIS — E785 Hyperlipidemia, unspecified: Secondary | ICD-10-CM

## 2024-10-25 DIAGNOSIS — I10 Essential (primary) hypertension: Secondary | ICD-10-CM | POA: Diagnosis not present

## 2024-10-25 DIAGNOSIS — Z7689 Persons encountering health services in other specified circumstances: Secondary | ICD-10-CM | POA: Diagnosis not present

## 2024-10-25 DIAGNOSIS — Z01812 Encounter for preprocedural laboratory examination: Secondary | ICD-10-CM | POA: Diagnosis not present

## 2024-10-25 DIAGNOSIS — R0609 Other forms of dyspnea: Secondary | ICD-10-CM

## 2024-10-25 DIAGNOSIS — R011 Cardiac murmur, unspecified: Secondary | ICD-10-CM | POA: Diagnosis not present

## 2024-10-25 MED ORDER — BLOOD PRESSURE MONITOR AUTOMAT DEVI
1.0000 [IU] | Freq: Once | 0 refills | Status: AC
  Filled 2024-10-25: qty 1, 30d supply, fill #0

## 2024-10-25 MED ORDER — METOPROLOL TARTRATE 100 MG PO TABS
100.0000 mg | ORAL_TABLET | Freq: Once | ORAL | 0 refills | Status: AC
  Filled 2024-10-25: qty 1, 1d supply, fill #0

## 2024-10-25 NOTE — Patient Instructions (Addendum)
 Medication Instructions:  Your physician recommends that you continue on your current medications as directed. Please refer to the Current Medication list given to you today.  *If you need a refill on your cardiac medications before your next appointment, please call your pharmacy* Please take your blood pressure twice daily for 1 week and send in a MyChart message. Please include heart rates. (One message at the end of the week).   HOW TO TAKE YOUR BLOOD PRESSURE: Rest 5 minutes before taking your blood pressure. Dont smoke or drink caffeinated beverages for at least 30 minutes before. Take your blood pressure before (not after) you eat. Sit comfortably with your back supported and both feet on the floor (dont cross your legs). Elevate your arm to heart level on a table or a desk. Use the proper sized cuff. It should fit smoothly and snugly around your bare upper arm. There should be enough room to slip a fingertip under the cuff. The bottom edge of the cuff should be 1 inch above the crease of the elbow. Ideally, take 3 measurements at one sitting and record the average.  Blood Pressure Log Date/Time Medications taken? (Y/N) Blood Pressure Heart Rate/Pulse                                                                                                                                                                                          Lab Work: BMET, Lipids If you have labs (blood work) drawn today and your tests are completely normal, you will receive your results only by: [employer] (if you have MyChart) OR A paper copy in the mail If you have any lab test that is abnormal or we need to change your treatment, we will call you to review the results.  Testing/Procedures: Your physician has requested that you have an echocardiogram. Echocardiography is a painless test that uses sound waves to create images of your heart. It provides your doctor with  information about the size and shape of your heart and how well your hearts chambers and valves are working. This procedure takes approximately one hour. There are no restrictions for this procedure. Please do NOT wear cologne, perfume, aftershave, or lotions (deodorant is allowed). Please arrive 15 minutes prior to your appointment time.  Please note: We ask at that you not bring children with you during ultrasound (echo/ vascular) testing. Due to room size and safety concerns, children are not allowed in the ultrasound rooms during exams. Our front office staff cannot provide observation of children in our lobby area while testing is being conducted. An adult accompanying a patient to their appointment will only be  allowed in the ultrasound room at the discretion of the ultrasound technician under special circumstances. We apologize for any inconvenience.    Your cardiac CT will be scheduled at one of the below locations:   [facility] [address]  [place], [place] [address]  If scheduled at the [facility] at [address], please enter the parking lot using the [address] entrance and use the FREE valet service at the patient drop-off area. Enter the building and check-in with registration on the main floor.  Please follow these instructions carefully (unless otherwise directed):  An IV will be required for this test and Nitroglycerin will be given.   On the Night Before the Test: Be sure to Drink plenty of water. Do not consume any caffeinated/decaffeinated beverages or chocolate 12 hours prior to your test. Do not take any antihistamines 12 hours prior to your test.  On the Day of the Test: Drink plenty of water until 1 hour prior to the test. Do not eat any food 1 hour prior to test. You may take your regular medications prior to the test.  Take metoprolol  (Lopressor ) two hours prior to test. If you take  Furosemide/Hydrochlorothiazide/Spironolactone/Chlorthalidone, please HOLD on the morning of the test. Patients who wear a continuous glucose monitor MUST remove the device prior to scanning. FEMALES- please wear underwire-free bra if available, avoid dresses & tight clothing      After the Test: Drink plenty of water. After receiving IV contrast, you may experience a mild flushed feeling. This is normal. On occasion, you may experience a mild rash up to 24 hours after the test. This is not dangerous. If this occurs, you can take Benadryl 25 mg, Zyrtec, Claritin, or Allegra and increase your fluid intake. (Patients taking Tikosyn should avoid Benadryl, and may take Zyrtec, Claritin, or Allegra) If you experience trouble breathing, this can be serious. If it is severe call 911 IMMEDIATELY. If it is mild, please call our office.  We will call to schedule your test 2-4 weeks out understanding that some insurance companies will need an authorization prior to the service being performed.   For more information and frequently asked questions, please visit our website : [email or web address]  For non-scheduling related questions, please contact the cardiac imaging nurse navigator should you have any questions/concerns: Cardiac Imaging Nurse Navigators Direct Office Dial: [phone]   For scheduling needs, including cancellations and rescheduling, please call Brittany, [phone].  Follow-Up: At [facility], you and your health needs are our priority.  As part of our continuing mission to provide you with exceptional heart care, our providers are all part of one team.  This team includes your primary Cardiologist (physician) and Advanced Practice Providers or APPs (Physician Assistants and Nurse Practitioners) who all work together to provide you with the care you need, when you need it.  Your next appointment:   3 month(s)  Provider:   Lonni LITTIE Nanas, MD      Other Instructions:

## 2024-10-26 ENCOUNTER — Ambulatory Visit: Payer: Self-pay | Admitting: Cardiology

## 2024-10-26 DIAGNOSIS — I10 Essential (primary) hypertension: Secondary | ICD-10-CM

## 2024-10-26 DIAGNOSIS — Z79899 Other long term (current) drug therapy: Secondary | ICD-10-CM

## 2024-10-26 DIAGNOSIS — E785 Hyperlipidemia, unspecified: Secondary | ICD-10-CM

## 2024-10-26 LAB — BASIC METABOLIC PANEL WITH GFR
BUN/Creatinine Ratio: 10 — ABNORMAL LOW (ref 12–28)
BUN: 8 mg/dL (ref 8–27)
CO2: 25 mmol/L (ref 20–29)
Calcium: 9.5 mg/dL (ref 8.7–10.3)
Chloride: 102 mmol/L (ref 96–106)
Creatinine, Ser: 0.83 mg/dL (ref 0.57–1.00)
Glucose: 106 mg/dL — ABNORMAL HIGH (ref 70–99)
Potassium: 4.6 mmol/L (ref 3.5–5.2)
Sodium: 142 mmol/L (ref 134–144)
eGFR: 80 mL/min/1.73

## 2024-10-26 LAB — LIPID PANEL
Chol/HDL Ratio: 4.6 ratio — ABNORMAL HIGH (ref 0.0–4.4)
Cholesterol, Total: 173 mg/dL (ref 100–199)
HDL: 38 mg/dL — ABNORMAL LOW
LDL Chol Calc (NIH): 88 mg/dL (ref 0–99)
Triglycerides: 280 mg/dL — ABNORMAL HIGH (ref 0–149)
VLDL Cholesterol Cal: 47 mg/dL — ABNORMAL HIGH (ref 5–40)

## 2024-11-13 ENCOUNTER — Other Ambulatory Visit: Payer: Self-pay | Admitting: Internal Medicine

## 2024-11-13 ENCOUNTER — Ambulatory Visit (HOSPITAL_COMMUNITY)
Admission: RE | Admit: 2024-11-13 | Discharge: 2024-11-13 | Disposition: A | Source: Ambulatory Visit | Attending: Cardiology

## 2024-11-13 ENCOUNTER — Ambulatory Visit (HOSPITAL_COMMUNITY)
Admission: RE | Admit: 2024-11-13 | Discharge: 2024-11-13 | Disposition: A | Source: Ambulatory Visit | Attending: Internal Medicine

## 2024-11-13 DIAGNOSIS — R931 Abnormal findings on diagnostic imaging of heart and coronary circulation: Secondary | ICD-10-CM

## 2024-11-13 DIAGNOSIS — R0609 Other forms of dyspnea: Secondary | ICD-10-CM

## 2024-11-13 MED ORDER — NITROGLYCERIN 0.4 MG SL SUBL
0.8000 mg | SUBLINGUAL_TABLET | Freq: Once | SUBLINGUAL | Status: AC
  Administered 2024-11-13: 0.8 mg via SUBLINGUAL

## 2024-11-13 MED ORDER — IOHEXOL 350 MG/ML SOLN
100.0000 mL | Freq: Once | INTRAVENOUS | Status: AC | PRN
  Administered 2024-11-13: 100 mL via INTRAVENOUS

## 2024-11-15 MED ORDER — ROSUVASTATIN CALCIUM 10 MG PO TABS: 10.0000 mg | ORAL_TABLET | Freq: Every day | ORAL | Status: DC

## 2024-11-16 MED ORDER — ROSUVASTATIN CALCIUM 10 MG PO TABS: 10.0000 mg | ORAL_TABLET | Freq: Every day | ORAL | 1 refills | Status: AC

## 2024-11-16 NOTE — Telephone Encounter (Signed)
 Left message, sent prescription to primary pharmacy on file.

## 2024-11-26 ENCOUNTER — Ambulatory Visit (HOSPITAL_COMMUNITY): Admission: RE | Admit: 2024-11-26 | Source: Ambulatory Visit

## 2025-01-21 ENCOUNTER — Encounter: Admitting: Registered Nurse

## 2025-02-04 ENCOUNTER — Ambulatory Visit: Admitting: Cardiology
